# Patient Record
Sex: Male | Born: 1935
Health system: Southern US, Community
[De-identification: ages and names within clinical notes are randomized; demographics above are authoritative.]

## PROBLEM LIST (undated history)

## (undated) DIAGNOSIS — E785 Hyperlipidemia, unspecified: Secondary | ICD-10-CM

## (undated) DIAGNOSIS — G5600 Carpal tunnel syndrome, unspecified upper limb: Secondary | ICD-10-CM

## (undated) DIAGNOSIS — H409 Unspecified glaucoma: Secondary | ICD-10-CM

## (undated) DIAGNOSIS — F329 Major depressive disorder, single episode, unspecified: Secondary | ICD-10-CM

## (undated) DIAGNOSIS — Z87898 Personal history of other specified conditions: Secondary | ICD-10-CM

## (undated) DIAGNOSIS — J019 Acute sinusitis, unspecified: Secondary | ICD-10-CM

## (undated) DIAGNOSIS — R5381 Other malaise: Secondary | ICD-10-CM

## (undated) DIAGNOSIS — I428 Other cardiomyopathies: Secondary | ICD-10-CM

## (undated) DIAGNOSIS — R5383 Other fatigue: Secondary | ICD-10-CM

## (undated) DIAGNOSIS — I252 Old myocardial infarction: Secondary | ICD-10-CM

## (undated) DIAGNOSIS — E739 Lactose intolerance, unspecified: Secondary | ICD-10-CM

## (undated) DIAGNOSIS — M1710 Unilateral primary osteoarthritis, unspecified knee: Secondary | ICD-10-CM

## (undated) DIAGNOSIS — M1990 Unspecified osteoarthritis, unspecified site: Secondary | ICD-10-CM

## (undated) DIAGNOSIS — J069 Acute upper respiratory infection, unspecified: Secondary | ICD-10-CM

## (undated) DIAGNOSIS — E7800 Pure hypercholesterolemia, unspecified: Secondary | ICD-10-CM

## (undated) DIAGNOSIS — IMO0002 Reserved for concepts with insufficient information to code with codable children: Secondary | ICD-10-CM

## (undated) DIAGNOSIS — J309 Allergic rhinitis, unspecified: Secondary | ICD-10-CM

## (undated) DIAGNOSIS — F411 Generalized anxiety disorder: Secondary | ICD-10-CM

## (undated) DIAGNOSIS — F3289 Other specified depressive episodes: Secondary | ICD-10-CM

## (undated) HISTORY — DX: Hyperlipidemia, unspecified: E78.5

## (undated) HISTORY — DX: Unspecified osteoarthritis, unspecified site: M19.90

## (undated) HISTORY — DX: Carpal tunnel syndrome, unspecified upper limb: G56.00

## (undated) HISTORY — DX: Other cardiomyopathies: I42.8

## (undated) HISTORY — DX: Unilateral primary osteoarthritis, unspecified knee: M17.10

## (undated) HISTORY — DX: Major depressive disorder, single episode, unspecified: F32.9

## (undated) HISTORY — DX: Lactose intolerance, unspecified: E73.9

## (undated) HISTORY — PX: CATARACT EXTRACTION: SUR2

## (undated) HISTORY — DX: Other specified depressive episodes: F32.89

## (undated) HISTORY — DX: Acute upper respiratory infection, unspecified: J06.9

## (undated) HISTORY — DX: Other fatigue: R53.83

## (undated) HISTORY — DX: Old myocardial infarction: I25.2

## (undated) HISTORY — DX: Personal history of other specified conditions: Z87.898

## (undated) HISTORY — DX: Allergic rhinitis, unspecified: J30.9

## (undated) HISTORY — DX: Unspecified glaucoma: H40.9

## (undated) HISTORY — DX: Other malaise: R53.81

## (undated) HISTORY — DX: Reserved for concepts with insufficient information to code with codable children: IMO0002

## (undated) HISTORY — DX: Pure hypercholesterolemia, unspecified: E78.00

## (undated) HISTORY — DX: Generalized anxiety disorder: F41.1

## (undated) HISTORY — DX: Acute sinusitis, unspecified: J01.90

---

## 1985-05-15 HISTORY — PX: TURP VAPORIZATION: SUR1397

## 2003-08-31 LAB — HM COLONOSCOPY

## 2003-10-23 ENCOUNTER — Encounter: Admission: RE | Admit: 2003-10-23 | Discharge: 2003-10-23 | Payer: Self-pay | Admitting: Sports Medicine

## 2003-10-30 ENCOUNTER — Encounter: Admission: RE | Admit: 2003-10-30 | Discharge: 2003-10-30 | Payer: Self-pay | Admitting: Family Medicine

## 2003-11-13 ENCOUNTER — Encounter: Admission: RE | Admit: 2003-11-13 | Discharge: 2003-11-13 | Payer: Self-pay | Admitting: Family Medicine

## 2003-11-20 ENCOUNTER — Encounter: Admission: RE | Admit: 2003-11-20 | Discharge: 2003-11-20 | Payer: Self-pay | Admitting: Sports Medicine

## 2004-02-22 ENCOUNTER — Ambulatory Visit (HOSPITAL_COMMUNITY): Admission: RE | Admit: 2004-02-22 | Discharge: 2004-02-22 | Payer: Self-pay | Admitting: Neurosurgery

## 2004-12-08 ENCOUNTER — Emergency Department (HOSPITAL_COMMUNITY): Admission: EM | Admit: 2004-12-08 | Discharge: 2004-12-08 | Payer: Self-pay | Admitting: Emergency Medicine

## 2005-04-13 ENCOUNTER — Inpatient Hospital Stay (HOSPITAL_COMMUNITY): Admission: EM | Admit: 2005-04-13 | Discharge: 2005-04-17 | Payer: Self-pay | Admitting: Emergency Medicine

## 2005-04-13 ENCOUNTER — Ambulatory Visit: Payer: Self-pay | Admitting: Cardiology

## 2005-04-14 ENCOUNTER — Encounter: Payer: Self-pay | Admitting: Internal Medicine

## 2005-04-14 ENCOUNTER — Ambulatory Visit: Payer: Self-pay | Admitting: Internal Medicine

## 2005-04-25 ENCOUNTER — Ambulatory Visit: Payer: Self-pay | Admitting: Internal Medicine

## 2005-04-28 ENCOUNTER — Ambulatory Visit: Payer: Self-pay | Admitting: Internal Medicine

## 2005-05-01 ENCOUNTER — Ambulatory Visit: Payer: Self-pay | Admitting: Cardiology

## 2005-05-01 ENCOUNTER — Emergency Department (HOSPITAL_COMMUNITY): Admission: EM | Admit: 2005-05-01 | Discharge: 2005-05-01 | Payer: Self-pay | Admitting: Emergency Medicine

## 2005-05-02 ENCOUNTER — Ambulatory Visit: Payer: Self-pay | Admitting: Cardiovascular Disease

## 2005-05-10 ENCOUNTER — Ambulatory Visit: Payer: Self-pay | Admitting: Internal Medicine

## 2005-05-11 ENCOUNTER — Encounter (HOSPITAL_COMMUNITY): Admission: RE | Admit: 2005-05-11 | Discharge: 2005-08-09 | Payer: Self-pay | Admitting: Cardiology

## 2005-05-17 ENCOUNTER — Ambulatory Visit: Payer: Self-pay | Admitting: Internal Medicine

## 2005-06-05 ENCOUNTER — Ambulatory Visit: Payer: Self-pay | Admitting: Cardiology

## 2005-06-13 ENCOUNTER — Ambulatory Visit: Payer: Self-pay

## 2005-06-13 ENCOUNTER — Encounter: Payer: Self-pay | Admitting: Cardiology

## 2005-07-13 ENCOUNTER — Ambulatory Visit: Payer: Self-pay | Admitting: Cardiology

## 2005-09-05 ENCOUNTER — Ambulatory Visit: Payer: Self-pay | Admitting: Internal Medicine

## 2005-09-08 ENCOUNTER — Ambulatory Visit: Payer: Self-pay | Admitting: Internal Medicine

## 2005-11-30 ENCOUNTER — Ambulatory Visit: Payer: Self-pay | Admitting: Internal Medicine

## 2005-12-05 ENCOUNTER — Ambulatory Visit: Admission: RE | Admit: 2005-12-05 | Discharge: 2005-12-05 | Payer: Self-pay | Admitting: Internal Medicine

## 2005-12-05 ENCOUNTER — Ambulatory Visit: Payer: Self-pay | Admitting: *Deleted

## 2005-12-06 ENCOUNTER — Ambulatory Visit: Payer: Self-pay | Admitting: Cardiology

## 2006-01-01 ENCOUNTER — Ambulatory Visit: Payer: Self-pay | Admitting: Internal Medicine

## 2006-01-11 ENCOUNTER — Ambulatory Visit: Payer: Self-pay | Admitting: Critical Care Medicine

## 2006-01-18 ENCOUNTER — Ambulatory Visit: Payer: Self-pay | Admitting: Cardiology

## 2006-02-06 ENCOUNTER — Encounter: Admission: RE | Admit: 2006-02-06 | Discharge: 2006-02-06 | Payer: Self-pay | Admitting: General Surgery

## 2006-04-17 ENCOUNTER — Ambulatory Visit: Payer: Self-pay | Admitting: Cardiology

## 2006-04-24 ENCOUNTER — Ambulatory Visit: Payer: Self-pay

## 2006-05-01 ENCOUNTER — Ambulatory Visit (HOSPITAL_COMMUNITY): Admission: RE | Admit: 2006-05-01 | Discharge: 2006-05-02 | Payer: Self-pay | Admitting: Cardiology

## 2006-05-22 ENCOUNTER — Ambulatory Visit: Payer: Self-pay | Admitting: Cardiology

## 2006-06-18 ENCOUNTER — Ambulatory Visit: Payer: Self-pay | Admitting: Internal Medicine

## 2006-06-18 LAB — CONVERTED CEMR LAB
Bacteria, UA: NEGATIVE
Bilirubin Urine: NEGATIVE
Crystals: NEGATIVE
Hemoglobin, Urine: NEGATIVE
Ketones, ur: NEGATIVE mg/dL
Leukocytes, UA: NEGATIVE
Mucus, UA: NEGATIVE
Nitrite: NEGATIVE
Specific Gravity, Urine: 1.01 (ref 1.000–1.03)
Total Protein, Urine: NEGATIVE mg/dL
Urine Glucose: NEGATIVE mg/dL
Urobilinogen, UA: 0.2 (ref 0.0–1.0)
pH: 7 (ref 5.0–8.0)

## 2006-07-09 ENCOUNTER — Ambulatory Visit: Payer: Self-pay | Admitting: Internal Medicine

## 2006-07-09 ENCOUNTER — Ambulatory Visit: Payer: Self-pay | Admitting: Cardiology

## 2006-07-09 LAB — CONVERTED CEMR LAB
ALT: 21 units/L (ref 0–40)
AST: 24 units/L (ref 0–37)
Albumin: 3.7 g/dL (ref 3.5–5.2)
Alkaline Phosphatase: 56 units/L (ref 39–117)
Bilirubin, Direct: 0.2 mg/dL (ref 0.0–0.3)
Cholesterol: 110 mg/dL (ref 0–200)
HDL: 39.3 mg/dL (ref >39.0)
Hgb A1c MFr Bld: 5.4 % (ref 4.6–6.0)
LDL Cholesterol: 60 mg/dL (ref 0–99)
Total Bilirubin: 0.8 mg/dL (ref 0.3–1.2)
Total CHOL/HDL Ratio: 2.8
Total Protein: 7 g/dL (ref 6.0–8.3)
Triglycerides: 53 mg/dL (ref 0–149)
VLDL: 11 mg/dL (ref 0–40)

## 2006-07-16 ENCOUNTER — Ambulatory Visit: Payer: Self-pay | Admitting: Cardiology

## 2006-10-26 ENCOUNTER — Ambulatory Visit: Payer: Self-pay | Admitting: Internal Medicine

## 2006-12-13 ENCOUNTER — Ambulatory Visit: Payer: Self-pay | Admitting: Internal Medicine

## 2007-04-05 ENCOUNTER — Encounter: Payer: Self-pay | Admitting: Internal Medicine

## 2007-06-14 DIAGNOSIS — I10 Essential (primary) hypertension: Secondary | ICD-10-CM | POA: Insufficient documentation

## 2007-06-14 DIAGNOSIS — F329 Major depressive disorder, single episode, unspecified: Secondary | ICD-10-CM

## 2007-06-14 DIAGNOSIS — Z87898 Personal history of other specified conditions: Secondary | ICD-10-CM

## 2007-06-14 DIAGNOSIS — I2510 Atherosclerotic heart disease of native coronary artery without angina pectoris: Secondary | ICD-10-CM

## 2007-06-14 DIAGNOSIS — H409 Unspecified glaucoma: Secondary | ICD-10-CM | POA: Insufficient documentation

## 2007-06-14 DIAGNOSIS — E785 Hyperlipidemia, unspecified: Secondary | ICD-10-CM

## 2007-07-18 ENCOUNTER — Ambulatory Visit: Payer: Self-pay | Admitting: Cardiology

## 2007-07-19 ENCOUNTER — Ambulatory Visit: Payer: Self-pay | Admitting: Cardiology

## 2007-07-19 LAB — CONVERTED CEMR LAB
ALT: 21 units/L (ref 0–53)
AST: 23 units/L (ref 0–37)
Albumin: 3.7 g/dL (ref 3.5–5.2)
Alkaline Phosphatase: 44 units/L (ref 39–117)
Bilirubin, Direct: 0.2 mg/dL (ref 0.0–0.3)
Cholesterol: 119 mg/dL (ref 0–200)
HDL: 35.6 mg/dL — ABNORMAL LOW (ref >39.0)
LDL Cholesterol: 69 mg/dL (ref 0–99)
Total Bilirubin: 1.1 mg/dL (ref 0.3–1.2)
Total CHOL/HDL Ratio: 3.3
Total Protein: 7.1 g/dL (ref 6.0–8.3)
Triglycerides: 72 mg/dL (ref 0–149)
VLDL: 14 mg/dL (ref 0–40)

## 2007-07-23 ENCOUNTER — Telehealth (INDEPENDENT_AMBULATORY_CARE_PROVIDER_SITE_OTHER): Payer: Self-pay | Admitting: *Deleted

## 2007-07-23 ENCOUNTER — Ambulatory Visit: Payer: Self-pay | Admitting: Internal Medicine

## 2007-07-23 DIAGNOSIS — J309 Allergic rhinitis, unspecified: Secondary | ICD-10-CM

## 2007-07-23 DIAGNOSIS — J019 Acute sinusitis, unspecified: Secondary | ICD-10-CM | POA: Insufficient documentation

## 2007-07-23 DIAGNOSIS — I252 Old myocardial infarction: Secondary | ICD-10-CM

## 2007-07-23 DIAGNOSIS — N400 Enlarged prostate without lower urinary tract symptoms: Secondary | ICD-10-CM

## 2007-07-23 DIAGNOSIS — F411 Generalized anxiety disorder: Secondary | ICD-10-CM | POA: Insufficient documentation

## 2007-10-03 ENCOUNTER — Ambulatory Visit: Payer: Self-pay | Admitting: Internal Medicine

## 2007-10-03 DIAGNOSIS — IMO0002 Reserved for concepts with insufficient information to code with codable children: Secondary | ICD-10-CM | POA: Insufficient documentation

## 2007-10-03 DIAGNOSIS — M1710 Unilateral primary osteoarthritis, unspecified knee: Secondary | ICD-10-CM | POA: Insufficient documentation

## 2008-01-21 ENCOUNTER — Ambulatory Visit: Payer: Self-pay | Admitting: Cardiology

## 2008-01-21 LAB — CONVERTED CEMR LAB
ALT: 25 units/L (ref 0–53)
AST: 25 units/L (ref 0–37)
Albumin: 4.2 g/dL (ref 3.5–5.2)
Alkaline Phosphatase: 49 units/L (ref 39–117)
BUN: 7 mg/dL (ref 6–23)
Bilirubin, Direct: 0.2 mg/dL (ref 0.0–0.3)
CO2: 27 meq/L (ref 19–32)
Calcium: 9.3 mg/dL (ref 8.4–10.5)
Chloride: 107 meq/L (ref 96–112)
Cholesterol: 120 mg/dL (ref 0–200)
Creatinine, Ser: 0.9 mg/dL (ref 0.4–1.5)
GFR calc Af Amer: 108 mL/min
GFR calc non Af Amer: 89 mL/min
Glucose, Bld: 114 mg/dL — ABNORMAL HIGH (ref 70–99)
HDL: 36.6 mg/dL — ABNORMAL LOW (ref >39.0)
LDL Cholesterol: 68 mg/dL (ref 0–99)
Potassium: 4.2 meq/L (ref 3.5–5.1)
Sodium: 140 meq/L (ref 135–145)
Total Bilirubin: 1.2 mg/dL (ref 0.3–1.2)
Total CHOL/HDL Ratio: 3.3
Total Protein: 7.5 g/dL (ref 6.0–8.3)
Triglycerides: 79 mg/dL (ref 0–149)
VLDL: 16 mg/dL (ref 0–40)

## 2008-04-14 ENCOUNTER — Ambulatory Visit: Payer: Self-pay | Admitting: Cardiology

## 2008-04-14 LAB — CONVERTED CEMR LAB
ALT: 20 units/L (ref 0–53)
AST: 25 units/L (ref 0–37)
Albumin: 3.8 g/dL (ref 3.5–5.2)
Alkaline Phosphatase: 47 units/L (ref 39–117)
Bilirubin, Direct: 0.1 mg/dL (ref 0.0–0.3)
Cholesterol: 104 mg/dL (ref 0–200)
HDL: 39.2 mg/dL (ref >39.0)
LDL Cholesterol: 51 mg/dL (ref 0–99)
Total Bilirubin: 1 mg/dL (ref 0.3–1.2)
Total CHOL/HDL Ratio: 2.7
Total Protein: 7 g/dL (ref 6.0–8.3)
Triglycerides: 70 mg/dL (ref 0–149)
VLDL: 14 mg/dL (ref 0–40)

## 2008-04-21 ENCOUNTER — Ambulatory Visit: Payer: Self-pay | Admitting: Internal Medicine

## 2008-04-21 LAB — CONVERTED CEMR LAB
Bilirubin Urine: NEGATIVE
Blood in Urine, dipstick: NEGATIVE
Glucose, Urine, Semiquant: NEGATIVE
Ketones, urine, test strip: NEGATIVE
Nitrite: NEGATIVE
Protein, U semiquant: 30
Specific Gravity, Urine: <1.005
Urobilinogen, UA: 0.2
WBC Urine, dipstick: NEGATIVE
pH: 8.5

## 2008-10-13 ENCOUNTER — Telehealth: Payer: Self-pay | Admitting: Internal Medicine

## 2008-10-14 ENCOUNTER — Ambulatory Visit: Payer: Self-pay | Admitting: Internal Medicine

## 2008-10-14 LAB — CONVERTED CEMR LAB
ALT: 22 units/L (ref 0–53)
AST: 31 units/L (ref 0–37)
BUN: 14 mg/dL (ref 6–23)
CO2: 27 meq/L (ref 19–32)
Calcium: 8.5 mg/dL (ref 8.4–10.5)
Chloride: 110 meq/L (ref 96–112)
Cholesterol: 104 mg/dL (ref 0–200)
Creatinine, Ser: 0.9 mg/dL (ref 0.4–1.5)
GFR calc non Af Amer: 88.5 mL/min (ref >60)
Glucose, Bld: 97 mg/dL (ref 70–99)
HDL: 37.7 mg/dL — ABNORMAL LOW (ref >39.00)
Hgb A1c MFr Bld: 5.4 % (ref 4.6–6.5)
LDL Cholesterol: 56 mg/dL (ref 0–99)
Potassium: 4.6 meq/L (ref 3.5–5.1)
Sodium: 141 meq/L (ref 135–145)
TSH: 1.31 microintl units/mL (ref 0.35–5.50)
Total CHOL/HDL Ratio: 3
Triglycerides: 52 mg/dL (ref 0.0–149.0)
VLDL: 10.4 mg/dL (ref 0.0–40.0)

## 2008-10-20 ENCOUNTER — Ambulatory Visit: Payer: Self-pay | Admitting: Internal Medicine

## 2008-10-20 DIAGNOSIS — J069 Acute upper respiratory infection, unspecified: Secondary | ICD-10-CM | POA: Insufficient documentation

## 2008-12-08 ENCOUNTER — Encounter (INDEPENDENT_AMBULATORY_CARE_PROVIDER_SITE_OTHER): Payer: Self-pay | Admitting: *Deleted

## 2009-02-10 DIAGNOSIS — I428 Other cardiomyopathies: Secondary | ICD-10-CM

## 2009-02-10 DIAGNOSIS — M1990 Unspecified osteoarthritis, unspecified site: Secondary | ICD-10-CM | POA: Insufficient documentation

## 2009-02-10 DIAGNOSIS — E7800 Pure hypercholesterolemia, unspecified: Secondary | ICD-10-CM

## 2009-02-11 ENCOUNTER — Ambulatory Visit: Payer: Self-pay | Admitting: Cardiology

## 2009-07-16 ENCOUNTER — Ambulatory Visit: Payer: Self-pay | Admitting: Internal Medicine

## 2009-07-16 DIAGNOSIS — G5600 Carpal tunnel syndrome, unspecified upper limb: Secondary | ICD-10-CM

## 2009-08-09 ENCOUNTER — Telehealth: Payer: Self-pay | Admitting: Internal Medicine

## 2009-09-10 ENCOUNTER — Telehealth: Payer: Self-pay | Admitting: Internal Medicine

## 2009-10-12 ENCOUNTER — Telehealth: Payer: Self-pay | Admitting: Internal Medicine

## 2010-02-15 ENCOUNTER — Ambulatory Visit: Payer: Self-pay | Admitting: Internal Medicine

## 2010-02-15 DIAGNOSIS — R5381 Other malaise: Secondary | ICD-10-CM

## 2010-02-15 DIAGNOSIS — R5383 Other fatigue: Secondary | ICD-10-CM

## 2010-02-15 DIAGNOSIS — E739 Lactose intolerance, unspecified: Secondary | ICD-10-CM

## 2010-02-15 LAB — CONVERTED CEMR LAB
ALT: 18 units/L (ref 0–53)
AST: 24 units/L (ref 0–37)
Albumin: 4 g/dL (ref 3.5–5.2)
Alkaline Phosphatase: 78 units/L (ref 39–117)
BUN: 11 mg/dL (ref 6–23)
Basophils Absolute: 0 10*3/uL (ref 0.0–0.1)
Basophils Relative: 0.4 % (ref 0.0–3.0)
Bilirubin Urine: NEGATIVE
Bilirubin, Direct: 0.2 mg/dL (ref 0.0–0.3)
CO2: 27 meq/L (ref 19–32)
Calcium: 9.4 mg/dL (ref 8.4–10.5)
Chloride: 109 meq/L (ref 96–112)
Cholesterol: 106 mg/dL (ref 0–200)
Creatinine, Ser: 0.8 mg/dL (ref 0.4–1.5)
Eosinophils Absolute: 0.3 10*3/uL (ref 0.0–0.7)
Eosinophils Relative: 4.5 % (ref 0.0–5.0)
GFR calc non Af Amer: 95.47 mL/min (ref >60)
Glucose, Bld: 102 mg/dL — ABNORMAL HIGH (ref 70–99)
HCT: 44 % (ref 39.0–52.0)
HDL: 43.5 mg/dL (ref >39.00)
Hemoglobin: 15.2 g/dL (ref 13.0–17.0)
Hgb A1c MFr Bld: 5.4 % (ref 4.6–6.5)
Ketones, ur: NEGATIVE mg/dL
LDL Cholesterol: 55 mg/dL (ref 0–99)
Leukocytes, UA: NEGATIVE
Lymphocytes Relative: 30.2 % (ref 12.0–46.0)
Lymphs Abs: 2 10*3/uL (ref 0.7–4.0)
MCHC: 34.5 g/dL (ref 30.0–36.0)
MCV: 95.5 fL (ref 78.0–100.0)
Monocytes Absolute: 0.5 10*3/uL (ref 0.1–1.0)
Monocytes Relative: 8.1 % (ref 3.0–12.0)
Neutro Abs: 3.8 10*3/uL (ref 1.4–7.7)
Neutrophils Relative %: 56.8 % (ref 43.0–77.0)
Nitrite: NEGATIVE
Platelets: 141 10*3/uL — ABNORMAL LOW (ref 150.0–400.0)
Potassium: 5.1 meq/L (ref 3.5–5.1)
Pro B Natriuretic peptide (BNP): 73.1 pg/mL (ref 0.0–100.0)
RBC: 4.6 M/uL (ref 4.22–5.81)
RDW: 12.9 % (ref 11.5–14.6)
Sed Rate: 8 mm/hr (ref 0–22)
Sodium: 142 meq/L (ref 135–145)
Specific Gravity, Urine: <=1.005 (ref 1.000–1.030)
TSH: 1.33 microintl units/mL (ref 0.35–5.50)
Total Bilirubin: 0.9 mg/dL (ref 0.3–1.2)
Total CHOL/HDL Ratio: 2
Total Protein, Urine: NEGATIVE mg/dL
Total Protein: 7.1 g/dL (ref 6.0–8.3)
Triglycerides: 39 mg/dL (ref 0.0–149.0)
Urine Glucose: NEGATIVE mg/dL
Urobilinogen, UA: 0.2 (ref 0.0–1.0)
VLDL: 7.8 mg/dL (ref 0.0–40.0)
WBC: 6.8 10*3/uL (ref 4.5–10.5)
pH: 6 (ref 5.0–8.0)

## 2010-03-07 ENCOUNTER — Encounter: Payer: Self-pay | Admitting: Cardiology

## 2010-03-07 ENCOUNTER — Ambulatory Visit: Payer: Self-pay | Admitting: Cardiology

## 2010-05-06 ENCOUNTER — Encounter: Payer: Self-pay | Admitting: Internal Medicine

## 2010-06-16 NOTE — Progress Notes (Signed)
Summary: Voltaren Gel  Phone Note Refill Request Message from:  Fax from Pharmacy on Oct 12, 2009 9:31 AM  Refills Requested: Medication #1:  VOLTAREN 1 % GEL apply 2 grams qid   Dosage confirmed as above?Dosage Confirmed   Brand Name Necessary? No   Supply Requested: 1 month   Last Refilled: 10/09/2009 Voltaren Gel 1% aty 500 apply 2 gms four times a day    Method Requested: Electronic Next Appointment Scheduled: None Initial call taken by: Glendell Docker CMA,  Oct 12, 2009 9:31 AM  Follow-up for Phone Call        ok to refill x 2 Follow-up by: D. Thomos Lemons DO,  October 13, 2009 12:16 PM  Additional Follow-up for Phone Call Additional follow up Details #1::        patient informed rx sent to pharmacy Additional Follow-up by: Glendell Docker CMA,  October 13, 2009 3:41 PM    Prescriptions: VOLTAREN 1 % GEL (DICLOFENAC SODIUM) apply 2 grams qid  #5 x 2   Entered by:   Glendell Docker CMA   Authorized by:   D. Thomos Lemons DO   Signed by:   Glendell Docker CMA on 10/13/2009   Method used:   Electronically to        [facility]  [phone]* (retail)       [address]       [place], [place]  [address]       Ph: [phone] or [phone]       Fax: [phone]   RxID:   [id number]

## 2010-06-16 NOTE — Assessment & Plan Note (Signed)
Summary: knee problem--last ov w/dr jwj-2009--stc   Vital Signs:  Patient profile:   75 year old male Height:      70 inches Weight:      178.25 pounds BMI:     25.67 O2 Sat:      94 % on Room air Temp:     98.6 degrees F oral Pulse rate:   76 / minute BP sitting:   102 / 58  (left arm) Cuff size:   regular  Vitals Entered By: Zella Ball Ewing CMA Duncan Dull) (February 15, 2010 11:26 AM)  O2 Flow:  Room air  Preventive Care Screening  Last Flu Shot:    Date:  01/20/2010    Results:  given      gets PSA with urology  CC: Knee problem, labs, refills/RE/wellness   Primary Care Makinsley Schiavi:  Dondra Spry DO  CC:  Knee problem, labs, and refills/RE/wellness.  History of Present Illness: here for wellness and f/u - c/o bilat knee pain, needs med refills, no recent falls or injury or swelling; Pt denies CP, worsening sob, doe, wheezing, orthopnea, pnd, worsening LE edema, palps, dizziness or syncope  Pt denies new neuro symptoms such as headache, facial or extremity weakness  Pt denies polydipsia, polyuria.  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  No worsening sweling or tenderness, fall or injury, but has ongoing pain, not completely controlled wtih voltaren gel  Did better with meloxicam last yr without hx of PUD, worsening renal dysfysn or other intolerance.    Here for wellness Diet: Heart Healthy or DM if diabetic Physical Activities: Sedentary - walks 1 hour per day Depression/mood screen: mild - declines tx at this time Hearing: decreased bilateral - has new hearing aids on order Visual Acuity: Grossly normal, gets exam twice yearly, wears glasses ADL's: Capable  Fall Risk: None Home Safety: Good Cognitive Impairment:  Gen appearance, affect, speech,  memory, attention & motor skills grossly intact End-of-Life Planning: Advance directive - Full code/I agree   Problems Prior to Update: 1)  Fatigue  (ICD-780.79) 2)  Glucose Intolerance  (ICD-271.3) 3)  Carpal Tunnel Syndrome, Left  (ICD-354.0) 4)  Hypertension  (ICD-401.9) 5)  Hyperlipidemia  (ICD-272.4) 6)  Coronary Artery Disease  (ICD-414.00) 7)  Cardiomyopathy  (ICD-425.4) 8)  Uri  (ICD-465.9) 9)  Health Maintenance Exam  (ICD-V70.0) 10)  Degenerative Joint Disease, Knees, Bilateral  (ICD-715.96) 11)  Anxiety  (ICD-300.00) 12)  Allergic Rhinitis  (ICD-477.9) 13)  Myocardial Infarction, Hx of  (ICD-412) 14)  Benign Prostatic Hypertrophy  (ICD-600.00) 15)  Sinusitis- Acute-nos  (ICD-461.9) 16)  Glaucoma  (ICD-365.9) 17)  Benign Prostatic Hypertrophy, Hx of  (ICD-V13.8) 18)  Depression  (ICD-311) 19)  Hypercholesterolemia  (ICD-272.0) 20)  Osteoarthritis  (ICD-715.90)  Medications Prior to Update: 1)  Niaspan 1000 Mg Tbcr (Niacin (Antihyperlipidemic)) .... Take 2 Tablet Every Night 2)  Plavix 75 Mg Tabs (Clopidogrel Bisulfate) .... Take 1 Tablet By Mouth Once A Day 3)  Zocor 20 Mg Tabs (Simvastatin) .... Take 1 Tablet By Mouth Every Night 4)  Ecotrin Low Strength 81 Mg  Tbec (Aspirin) .... Take 1 Tablet By Mouth Once A Day 5)  Diovan 40 Mg  Tabs (Valsartan) .Marland Kitchen.. 1po Qd 6)  Doxazosin Mesylate 8 Mg  Tabs (  Doxazosin Mesylate) .... Take 1/2  Tablet By Mouth Once A Day 7)  Carvedilol 25 Mg  Tabs (Carvedilol) .... Take 1/2 Tab Qam and Take 1/2 Qhs 8)  Nitroglycerin 0.4 Mg Subl (Nitroglycerin) .... As Needed As Directed For Chest Pain 9)  Voltaren 1 % Gel (Diclofenac Sodium) .... Apply 2 Grams Qid 10)  Travatan Z 0.004 % Soln (Travoprost) .... At Bedtime 11)  Bee Pollen 500 Mg Tabs (Bee Pollen) .... Take 1 Tablet By Mouth Two Times A Day 12)  Osteo Bi-Flex Joint Shield  Tabs (Misc Natural Products) .... Take 1 Tablet By Mouth Two Times A Day 13)  Citalopram Hydrobromide 20 Mg Tabs  (Citalopram Hydrobromide) .... Take 1 Tablet By Mouth Once A Day 14)  Fexofenadine Hcl 180 Mg Tabs (Fexofenadine Hcl) .... One By Mouth Once Daily  Current Medications (verified): 1)  Niaspan 1000 Mg Tbcr (Niacin (Antihyperlipidemic)) .... Take 2 Tablet Every Night 2)  Plavix 75 Mg Tabs (Clopidogrel Bisulfate) .... Take 1 Tablet By Mouth Once A Day 3)  Zocor 20 Mg Tabs (Simvastatin) .... Take 1 Tablet By Mouth Every Night 4)  Ecotrin Low Strength 81 Mg  Tbec (Aspirin) .... Take 1 Tablet By Mouth Once A Day 5)  Diovan 40 Mg  Tabs (Valsartan) .Marland Kitchen.. 1po Qd 6)  Doxazosin Mesylate 8 Mg  Tabs (Doxazosin Mesylate) .... Take 1/2  Tablet By Mouth Once A Day 7)  Carvedilol 25 Mg  Tabs (Carvedilol) .... Take 1/2 Tab Qam and Take 1/2 Qhs 8)  Nitroglycerin 0.4 Mg Subl (Nitroglycerin) .... As Needed As Directed For Chest Pain 9)  Voltaren 1 % Gel (Diclofenac Sodium) .... Apply 2 Grams Qid 10)  Travatan Z 0.004 % Soln (Travoprost) .... At Bedtime 11)  Bee Pollen 500 Mg Tabs (Bee Pollen) .... Take 1 Tablet By Mouth Two Times A Day 12)  Osteo Bi-Flex Joint Shield  Tabs (Misc Natural Products) .... Take 1 Tablet By Mouth Two Times A Day 13)  Citalopram Hydrobromide 20 Mg Tabs (Citalopram Hydrobromide) .... Take 1 Tablet By Mouth Once A Day 14)  Fexofenadine Hcl 180 Mg Tabs (Fexofenadine Hcl) .... One By Mouth Once Daily 15)  Meloxicam 15 Mg Tabs (Meloxicam) .Marland Kitchen.. 1po Once Daily As Needed  Allergies (verified): No Known Drug Allergies  Past History:  Family History: Last updated: 07/16/2009 brother alive but had CVA this year, Brother died of alcoholic cirrhosis, Father MI at 19, Mother CVA at 45, one healthy brother, sister died of MS or ALS      Social History: Last updated: 07/16/2009 lives alone, divorced in 1987, no kids, semi-retired truck Hospital doctor, grew up in [place], enjoys trucks, reading and TV; quit smoking 10 yrs ago, drinks a glass of wine daily, no illicit drug use      Risk  Factors: Alcohol Use: 4 (10/20/2008) Caffeine Use: 2 beverages daily (10/20/2008) Exercise: yes (10/20/2008)  Risk Factors: Smoking Status: quit (07/16/2009)  Past Medical History: HYPERTENSION (ICD-401.9) HYPERLIPIDEMIA (ICD-272.4)  CORONARY ARTERY DISEASE (ICD-414.00) (November 2006 anterior myocardial     infarction.  Occluded right coronary artery.  He had stents placed     to his LAD.  His EF was 35%.  Last catheterization was in 2007.  He     had an LAD with 2 overlapping stents.  The mid vessel had 30%     stenosis.  There was a small diagonal.  This was jailed. The     circumflex had 40% ostial stenosis.  In the marginal,  he had 30%     proximal stenosis.  The right coronary was occluded). CARDIOMYOPATHY (ICD-425.4)  (EF was 35% initially, increased to 54% on cath 07) URI (ICD-465.9) HEALTH MAINTENANCE EXAM (ICD-V70.0) DEGENERATIVE JOINT DISEASE, KNEES, BILATERAL (ICD-715.96) ANXIETY (ICD-300.00) ALLERGIC RHINITIS (ICD-477.9) MYOCARDIAL INFARCTION, HX OF (ICD-412) BENIGN PROSTATIC HYPERTROPHY (ICD-600.00) SINUSITIS- ACUTE-NOS (ICD-461.9) GLAUCOMA (ICD-365.9) left eye BENIGN PROSTATIC HYPERTROPHY, HX OF (ICD-V13.8) DEPRESSION (ICD-311) HYPERCHOLESTEROLEMIA (ICD-272.0) OSTEOARTHRITIS (ICD-715.90) DJD - bilat knees MD roster:    optho- [facility] on [address]                     urology - Dr Retta Diones                      card -  Dr Ted Mcalpine - Dr Elmon Else                      Past Surgical History: Reviewed history from 07/16/2009 and no changes required. TURP - 05/15/1985    Family History: Reviewed history from 07/16/2009 and no changes required. brother alive but had CVA this year, Brother died of alcoholic cirrhosis, Father MI at 54, Mother CVA at 19, one healthy brother, sister died of MS or ALS      Social History: Reviewed history from 07/16/2009 and no changes required. 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  The patient denies anorexia, fever, vision loss, decreased hearing, hoarseness, chest pain, syncope, dyspnea on exertion, peripheral edema, prolonged cough, headaches, hemoptysis, abdominal pain, melena, hematochezia, severe indigestion/heartburn, hematuria, muscle weakness, suspicious skin lesions, transient blindness, depression, unusual weight change, abnormal bleeding, enlarged lymph nodes, and angioedema.         all otherwise negative per pt -  - to see cards oct 24;  has some very mild fatigue at best, intermittent - normally states energy pretty good  Physical Exam  General:  alert and well-developed.  alert and well-developed.   Head:  normocephalic and atraumatic.  normocephalic and atraumatic.   Eyes:  vision grossly intact, pupils equal, and pupils round.  vision grossly intact, pupils equal, and pupils round.   Ears:  R ear normal and L ear normal.  R ear normal and L ear normal.   Nose:  no external deformity and no nasal discharge.  no external deformity and no nasal discharge.   Mouth:  no gingival abnormalities and pharynx pink and moist.  no gingival abnormalities and pharynx pink and moist.   Neck:  supple and no masses.  supple and no masses.   Lungs:  normal respiratory effort and normal breath sounds.  normal respiratory effort and normal breath sounds.   Heart:  normal rate and regular rhythm.  normal rate and regular rhythm.   Abdomen:  soft, non-tender, and normal bowel sounds.  soft, non-tender, and normal bowel sounds.   Msk:  bialt knees with significant crepitus, no effusion Extremities:  no edema, no erythema  Neurologic:  cognitive intact to orientation, recall, naming, and repetition cranial nerves II-XII intact and strength normal in all  extremities.  cranial nerves II-XII intact and strength normal in all extremities.     Impression & Recommendations:  Problem # 1:  Preventive Health Care (ICD-V70.0)  Overall doing well, age appropriate education and counseling updated and referral for appropriate preventive services done unless declined, immunizations up to date or declined, diet counseling done if overweight, urged to quit smoking if smokes , most recent labs reviewed and current ordered if appropriate, ecg reviewed or declined (interpretation per ECG scanned in the EMR if done); information regarding Medicare Prevention requirements given if appropriate; speciality referrals updated as appropriate ;  declines AAA screen with aortic u/s at this time  Orders: Medicare -1st Annual Wellness Visit [phone])  Problem # 2:  DEGENERATIVE JOINT DISEASE, KNEES, BILATERAL (ICD-715.96)  His updated medication list for this problem includes:    Ecotrin Low Strength 81 Mg Tbec (Aspirin) .Marland Kitchen... Take 1 tablet by mouth once a day    Meloxicam 15 Mg Tabs (Meloxicam) .Marland Kitchen... 1po once daily as needed stable overall by hx and exam, ok to continue meds/tx as is - for med refills today  Orders: Prescription Created Electronically [phone])  Problem # 3:  GLUCOSE INTOLERANCE (ICD-271.3)  per pt  - for a1c check today  Orders: TLB-A1C / Hgb A1C (Glycohemoglobin) (83036-A1C)  Problem # 4:  HYPERTENSION (ICD-401.9)  His updated medication list for this problem includes:    Diovan 40 Mg Tabs (Valsartan) .Marland Kitchen... 1po qd    Doxazosin Mesylate 8 Mg Tabs (Doxazosin mesylate) .Marland Kitchen... Take 1/2  tablet by mouth once a day    Carvedilol 25 Mg Tabs (Carvedilol) .Marland Kitchen... Take 1/2 tab qam and take 1/2 qhs  Orders: TLB-Udip w/ Micro (81001-URINE)  BP today: 102/58 Prior BP: 94/70 (07/16/2009)  Labs Reviewed: K+: 4.6 (10/14/2008) Creat: : 0.9 (10/14/2008)   Chol: 104 (10/14/2008)   HDL: 37.70 (10/14/2008)   LDL: 56 (10/14/2008)   TG: 52.0  (10/14/2008) stable overall by hx and exam, ok to continue meds/tx as is   Problem # 5:  HYPERLIPIDEMIA (ICD-272.4)  His updated medication list for this problem includes:    Niaspan 1000 Mg Tbcr (Niacin (antihyperlipidemic)) .Marland Kitchen... Take 2 tablet every night    Zocor 20 Mg Tabs (Simvastatin) .Marland Kitchen... Take 1 tablet by mouth every night  Labs Reviewed: SGOT: 31 (10/14/2008)   SGPT: 22 (10/14/2008)   HDL:37.70 (10/14/2008), 39.2 (04/14/2008)  LDL:56 (10/14/2008), 51 (04/14/2008)  Chol:104 (10/14/2008), 104 (04/14/2008)  Trig:52.0 (10/14/2008), 70 (04/14/2008) stable overall by hx and exam, ok to continue meds/tx as is , Pt to continue diet efforts, good med tolerance; to check labs - goal LDL less than 70   Orders: TLB-Lipid Panel (80061-LIPID)  Problem # 6:  FATIGUE (ICD-780.79)  minor, exam benign, to check labs below; follow with expectant management , also check BNP  Orders: TLB-BMP (Basic Metabolic Panel-BMET) (80048-METABOL) TLB-CBC Platelet - w/Differential (85025-CBCD) TLB-Hepatic/Liver Function Pnl (80076-HEPATIC) TLB-TSH (Thyroid Stimulating Hormone) (84443-TSH) TLB-Sedimentation Rate (ESR) (85652-ESR)  Complete Medication List: 1)  Niaspan 1000 Mg Tbcr (Niacin (antihyperlipidemic)) .... Take 2 tablet every night 2)  Plavix 75 Mg Tabs (Clopidogrel bisulfate) .... Take 1 tablet by mouth once a day 3)  Zocor 20 Mg Tabs (Simvastatin) .... Take 1 tablet by mouth every night 4)  Ecotrin Low Strength 81 Mg Tbec (Aspirin) .... Take 1 tablet by mouth once a day 5)  Diovan 40 Mg Tabs (Valsartan) .Marland Kitchen.. 1po qd 6)  Doxazosin Mesylate 8 Mg Tabs (Doxazosin mesylate) .... Take 1/2  tablet by mouth once  a day 7)  Carvedilol 25 Mg Tabs (Carvedilol) .... Take 1/2 tab qam and take 1/2 qhs 8)  Nitroglycerin 0.4 Mg Subl (Nitroglycerin) .... As needed as directed for chest pain 9)  Voltaren 1 % Gel (Diclofenac sodium) .... Apply 2 grams qid 10)  Travatan Z 0.004 % Soln (Travoprost) .... At  bedtime 11)  Bee Pollen 500 Mg Tabs (Bee pollen) .... Take 1 tablet by mouth two times a day 12)  Osteo Bi-flex Joint Shield Tabs (Misc natural products) .... Take 1 tablet by mouth two times a day 13)  Citalopram Hydrobromide 20 Mg Tabs (Citalopram hydrobromide) .... Take 1 tablet by mouth once a day 14)  Fexofenadine Hcl 180 Mg Tabs (Fexofenadine hcl) .... One by mouth once daily 15)  Meloxicam 15 Mg Tabs (Meloxicam) .Marland Kitchen.. 1po once daily as needed  Other Orders: TLB-BNP (B-Natriuretic Peptide) (83880-BNPR)  Patient Instructions: 1)  Please go to the Lab in the basement for your blood and/or urine tests today 2)  Please call the number on the [facility] Card for results of your testing 3)  Continue all previous medications as before this visit 4)  Please keep your appts with your specialists as you have planned 5)  please call if you want the aortic aneurysm screening done (the aortic ultrasound) 6)  your meloxicam and the voltaren are refilled today 7)  please have the pharmacy call if you need refills for other medications 8)  Please schedule a follow-up appointment in 1 year, or sooner if needed Prescriptions: MELOXICAM 15 MG TABS (MELOXICAM) 1po once daily as needed  #90 x 3   Entered and Authorized by:   Corwin Levins MD   Signed by:   Corwin Levins MD on 02/15/2010   Method used:   Electronically to        [facility]  [phone]* (retail)       [address]       [place], [place]  [address]       Ph: [phone] or [phone]       Fax: [phone]   RxID:   [id number] VOLTAREN 1 % GEL (DICLOFENAC SODIUM) apply 2 grams qid  ##5 x 3   Entered and Authorized by:   Corwin Levins MD   Signed by:   Corwin Levins MD on 02/15/2010   Method used:   Print then Give to Patient   RxID:   [id number]

## 2010-06-16 NOTE — Letter (Signed)
[facility]: [facility]   Imported By: Sherian Rein 05/17/2010 07:42:25  _____________________________________________________________________  External Attachment:    Type:   Image     Comment:   External Document

## 2010-06-16 NOTE — Assessment & Plan Note (Signed)
Summary: arm goes to sleep on & off x1 week- jr   Vital Signs:  Patient profile:   75 year old male Weight:      186.25 pounds BMI:     26.82 O2 Sat:      98 % on Room air Temp:     97.4 degrees F oral Pulse rate:   76 / minute Pulse rhythm:   regular Resp:     18 per minute BP sitting:   94 / 70  (right arm) Cuff size:   large  Vitals Entered By: Glendell Docker CMA (July 16, 2009 8:17 AM)  O2 Flow:  Room air CC: Rm 3-left arm falls asleep  Comments c/o left arm falling asleep for the past  2 weeks   Primary Care Provider:  DThomos Lemons DO  CC:  Rm 3-left arm falls asleep .  History of Present Illness: 75 y/o white male c/o left hand falling asleep.   occ upper arm discomfort no neck pain no upper ext weakness  Preventive Screening-Counseling & Management  Alcohol-Tobacco     Smoking Status: quit  Allergies (verified): No Known Drug Allergies  Past History:  Past Medical History: HYPERTENSION (ICD-401.9) HYPERLIPIDEMIA (ICD-272.4)  CORONARY ARTERY DISEASE (ICD-414.00) (November 2006 anterior myocardial     infarction.  Occluded right coronary artery.  He had stents placed     to his LAD.  His EF was 35%.  Last catheterization was in 2007.  He     had an LAD with 2 overlapping stents.  The mid vessel had 30%     stenosis.  There was a small diagonal.  This was jailed. The     circumflex had 40% ostial stenosis.  In the marginal, he had 30%     proximal stenosis.  The right coronary was occluded). CARDIOMYOPATHY (ICD-425.4)  (EF was 35% initially, increased to 54% on cath 07) URI (ICD-465.9) HEALTH MAINTENANCE EXAM (ICD-V70.0) DEGENERATIVE JOINT DISEASE, KNEES, BILATERAL (ICD-715.96) ANXIETY (ICD-300.00) ALLERGIC RHINITIS (ICD-477.9) MYOCARDIAL INFARCTION, HX OF (ICD-412) BENIGN PROSTATIC HYPERTROPHY (ICD-600.00) SINUSITIS- ACUTE-NOS (ICD-461.9) GLAUCOMA (ICD-365.9) left eye BENIGN PROSTATIC HYPERTROPHY, HX OF (ICD-V13.8) DEPRESSION  (ICD-311) HYPERCHOLESTEROLEMIA (ICD-272.0) OSTEOARTHRITIS (ICD-715.90) DJD - bilat knees    Past Surgical History: TURP - 05/15/1985    Family History: brother alive but had CVA this year, Brother died of alcoholic cirrhosis, Father MI at 55, Mother CVA at 23, one healthy brother, sister died of MS or ALS      Social History: lives alone, divorced in 1987, no kids, semi-retired truck Hospital doctor, 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   Lungs:  normal respiratory effort and normal breath sounds.   Heart:  normal rate, regular rhythm, no murmur, and no gallop.   Msk:  positive phalens test Neurologic:  cranial nerves II-XII intact, strength normal in all extremities, and DTRs symmetrical and normal.     Impression & Recommendations:  Problem # 1:  CARPAL TUNNEL SYNDROME, LEFT (ICD-354.0) Pt with left hand numbness.  I suspect CTS.  use wrist splint.   we reviewed stretching exercises.  If no improvement, refer to Dr. Teressa Senter  Complete Medication List: 1)  Niaspan 1000 Mg Tbcr (Niacin (antihyperlipidemic)) .... Take 2 tablet every night 2)  Plavix 75 Mg Tabs (Clopidogrel bisulfate) .... Take 1 tablet by mouth once a day 3)  Zocor 20 Mg Tabs (Simvastatin) .... Take 1 tablet by mouth every night 4)  Ecotrin Low Strength 81 Mg Tbec (Aspirin) .... Take 1 tablet by mouth once a day 5)  Diovan 40 Mg Tabs (Valsartan) .Marland Kitchen.. 1po qd 6)  Doxazosin Mesylate 8 Mg Tabs (Doxazosin mesylate) .... Take 1/2  tablet by mouth once a day 7)  Carvedilol 25 Mg Tabs (Carvedilol) .... Take 1/2 tab qam and take 1/2 qhs 8)  Nitroglycerin 0.4 Mg Subl (Nitroglycerin) .... As needed as directed for chest pain 9)  Voltaren 1 % Gel (Diclofenac sodium) .... Apply 2 grams qid 10)  Travatan Z 0.004 % Soln (Travoprost)  .... At bedtime 11)  Bee Pollen 500 Mg Tabs (Bee pollen) .... Take 1 tablet by mouth two times a day 12)  Osteo Bi-flex Joint Shield Tabs (Misc natural products) .... Take 1 tablet by mouth two times a day 13)  Citalopram Hydrobromide 20 Mg Tabs (Citalopram hydrobromide) .... 1/2 by mouth once daily x 7 days, then one by mouth once daily 14)  Fexofenadine Hcl 180 Mg Tabs (Fexofenadine hcl) .... One by mouth once daily  Patient Instructions: 1)  Use left wrist splint daily  2)  Perform stretching exercises as directed 3)  Please schedule a follow-up appointment in 1 month. Prescriptions: FEXOFENADINE HCL 180 MG TABS (FEXOFENADINE HCL) one by mouth once daily  #30 x 5   Entered and Authorized by:   D. Thomos Lemons DO   Signed by:   D. Thomos Lemons DO on 07/16/2009   Method used:   Electronically to        [facility]  [phone]* (retail)       [address]       [place], [place]  [address]       Ph: [phone] or [phone]       Fax: [phone]   RxID:   [id number] CITALOPRAM HYDROBROMIDE 20 MG TABS (CITALOPRAM HYDROBROMIDE) 1/2 by mouth once daily x 7 days, then one by mouth once daily  #30 x 0   Entered and Authorized by:   D. Thomos Lemons DO   Signed by:   D. Thomos Lemons DO on 07/16/2009   Method used:   Electronically to        [facility]  [phone]* (retail)       [address]       [place], [place]  [address]       Ph: [phone] or [phone]       Fax: [phone]   RxID:   [id number]   Current Allergies (reviewed today): No known allergies    Immunization History:  Influenza Immunization History:    Influenza:  historical (03/02/2009)

## 2010-06-16 NOTE — Progress Notes (Signed)
Summary: Citalopram  Refill  Phone Note Refill Request Message from:  Fax from Pharmacy on August 09, 2009 9:46 AM  Refills Requested: Medication #1:  CITALOPRAM HYDROBROMIDE 20 MG TABS 1/2 by mouth once daily x 7 days   Dosage confirmed as above?Dosage Confirmed   Brand Name Necessary? No   Supply Requested: 1 month   Last Refilled: 07/16/2009  Method Requested: Electronic Next Appointment Scheduled: 08-17-09 815 dR Renley Gutman  Initial call taken by: Roselle Locus,  August 09, 2009 9:46 AM    New/Updated Medications: CITALOPRAM HYDROBROMIDE 20 MG TABS (CITALOPRAM HYDROBROMIDE) Take 1 tablet by mouth once a day Prescriptions: CITALOPRAM HYDROBROMIDE 20 MG TABS (CITALOPRAM HYDROBROMIDE) Take 1 tablet by mouth once a day  #30 x 0   Entered by:   Glendell Docker CMA   Authorized by:   D. Thomos Lemons DO   Signed by:   Glendell Docker CMA on 08/09/2009   Method used:   Electronically to        [facility]  [phone]* (retail)       [address]       [place], [place]  [address]       Ph: [phone] or [phone]       Fax: [phone]   RxID:   [id number]

## 2010-06-16 NOTE — Progress Notes (Signed)
Summary: Citalopram refill  Phone Note Refill Request Message from:  Fax from Pharmacy on September 10, 2009 9:33 AM  Refills Requested: Medication #1:  CITALOPRAM HYDROBROMIDE 20 MG TABS Take 1 tablet by mouth once a day   Dosage confirmed as above?Dosage Confirmed Next Appointment Scheduled: none Initial call taken by: Lucious Groves,  September 10, 2009 9:33 AM  Follow-up for Phone Call        ok to refill citalopram x 1 needs OV for additional refills Follow-up by: D. Thomos Lemons DO,  September 10, 2009 2:35 PM  Additional Follow-up for Phone Call Additional follow up Details #1::        phoned in. Additional Follow-up by: Lucious Groves,  September 10, 2009 2:55 PM    Prescriptions: CITALOPRAM HYDROBROMIDE 20 MG TABS (CITALOPRAM HYDROBROMIDE) Take 1 tablet by mouth once a day  #30 x 0   Entered by:   Lucious Groves   Authorized by:   D. Thomos Lemons DO   Signed by:   Lucious Groves on 09/10/2009   Method used:   Telephoned to ...       [facility]  [address]  [phone]* (retail)       [address]       [place], [place]  [address]       Ph: [phone] or [phone]       Fax: [phone]   RxID:   [id number]

## 2010-06-16 NOTE — Assessment & Plan Note (Signed)
Summary: 3 yr 414.01   pfh,rn      Allergies Added: NKDA  Visit Type:  Follow-up Primary Provider:  Dondra Spry DO  CC:  CAD.  History of Present Illness: The patient presents for yearly followup. Since I last saw him he has done extremely well. He denies any chest pressure, neck or arm discomfort. He has no shortness of breath, PND or orthopnea. He is walking daily for exercise. He has no palpitations, presyncope or syncope. He has had no weight gain or edema.  Current Medications (verified): 1)  Niaspan 1000 Mg Tbcr (Niacin (Antihyperlipidemic)) .... Take 2 Tablet Every Night 2)  Plavix 75 Mg Tabs (Clopidogrel Bisulfate) .... Take 1 Tablet By Mouth Once A Day 3)  Zocor 20 Mg Tabs (Simvastatin) .... Take 1 Tablet By Mouth Every Night 4)  Ecotrin Low Strength 81 Mg  Tbec (Aspirin) .... Take 1 Tablet By Mouth Once A Day 5)  Diovan 40 Mg  Tabs (Valsartan) .Marland Kitchen.. 1po Qd 6)  Doxazosin Mesylate 8 Mg  Tabs (Doxazosin Mesylate) .... Take 1/2  Tablet By Mouth Once A Day 7)  Carvedilol 25 Mg  Tabs (Carvedilol) .... Take 1/2 Tab Qam and Take 1/2 Qhs 8)  Nitroglycerin 0.4 Mg Subl (Nitroglycerin) .... As Needed As Directed For Chest Pain 9)  Voltaren 1 % Gel (Diclofenac Sodium) .... Apply 2 Grams Qid 10)  Travatan Z 0.004 % Soln (Travoprost) .... At Bedtime 11)  Bee Pollen 500 Mg Tabs (Bee Pollen) .... Take 1 Tablet By Mouth Two Times A Day 12)  Osteo Bi-Flex Joint Shield  Tabs (Misc Natural Products) .... Take 1 Tablet By Mouth Two Times A Day  Allergies (verified): No Known Drug Allergies  Past History:  Past Medical History: Reviewed history from 02/15/2010 and no changes required. HYPERTENSION (ICD-401.9) HYPERLIPIDEMIA (ICD-272.4)  CORONARY ARTERY DISEASE (ICD-414.00) (November 2006 anterior myocardial     infarction.  Occluded right coronary artery.  He had stents placed     to his LAD.  His EF was 35%.  Last catheterization was in 2007.  He     had an LAD with 2 overlapping stents.   The mid vessel had 30%     stenosis.  There was a small diagonal.  This was jailed. The     circumflex had 40% ostial stenosis.  In the marginal, he had 30%     proximal stenosis.  The right coronary was occluded). CARDIOMYOPATHY (ICD-425.4)  (EF was 35% initially, increased to 54% on cath 07) URI (ICD-465.9) HEALTH MAINTENANCE EXAM (ICD-V70.0) DEGENERATIVE JOINT DISEASE, KNEES, BILATERAL (ICD-715.96) ANXIETY (ICD-300.00) ALLERGIC RHINITIS (ICD-477.9) MYOCARDIAL INFARCTION, HX OF (ICD-412) BENIGN PROSTATIC HYPERTROPHY (ICD-600.00) SINUSITIS- ACUTE-NOS (ICD-461.9) GLAUCOMA (ICD-365.9) left eye BENIGN PROSTATIC HYPERTROPHY, HX OF (ICD-V13.8) DEPRESSION (ICD-311) HYPERCHOLESTEROLEMIA (ICD-272.0) OSTEOARTHRITIS (ICD-715.90) DJD - bilat knees MD roster:    optho- [facility] on [address]                     urology - Dr Retta Diones                      card -  Dr Ted Mcalpine - Dr Elmon Else                      Past Surgical History: Reviewed history from 07/16/2009 and no changes required. TURP -  05/15/1985    Review of Systems       As stated in the HPI and negative for all other systems.   Vital Signs:  Patient profile:   75 year old male Height:      70 inches Weight:      178 pounds BMI:     25.63 Pulse rate:   66 / minute Resp:     16 per minute BP sitting:   102 / 72  (right arm)  Vitals Entered By: Marrion Coy, CNA (March 07, 2010 8:43 AM)  Physical Exam  General:  Well developed, well nourished, in no acute distress. Head:  normocephalic and atraumatic Eyes:  PERRLA/EOM intact; conjunctiva and lids normal. Mouth:  Teeth, gums and palate normal. Oral mucosa normal. Neck:  Neck supple, no JVD. No masses, thyromegaly or abnormal cervical nodes. Chest Wall:  no deformities or breast masses noted Lungs:  Clear bilaterally to auscultation and percussion. Abdomen:  Bowel sounds positive; abdomen soft and non-tender without masses,  organomegaly, or hernias noted. No hepatosplenomegaly. Msk:  Back normal, normal gait. Muscle strength and tone normal. Extremities:  No clubbing or cyanosis. Neurologic:  Alert and oriented x 3. Skin:  Intact without lesions or rashes. Cervical Nodes:  no significant adenopathy Inguinal Nodes:  no significant adenopathy Psych:  Normal affect.   Detailed Cardiovascular Exam  Neck    Carotids: Carotids full and equal bilaterally without bruits.      Neck Veins: Normal, no JVD.    Heart    Inspection: no deformities or lifts noted.      Palpation: normal PMI with no thrills palpable.      Auscultation: regular rate and rhythm, S1, S2 without murmurs, rubs, gallops, or clicks.    Vascular    Abdominal Aorta: no palpable masses, pulsations, or audible bruits.      Femoral Pulses: normal femoral pulses bilaterally.      Pedal Pulses: normal pedal pulses bilaterally.      Radial Pulses: normal radial pulses bilaterally.      Peripheral Circulation: no clubbing, cyanosis, or edema noted with normal capillary refill.     EKG  Procedure date:  03/07/2010  Findings:      Reviewed today and reported elsewhere  Impression & Recommendations:  Problem # 1:  CORONARY ARTERY DISEASE (ICD-414.00) He is doing well and we will continue with risk reduction. No further testing is suggested. Orders: EKG w/ Interpretation (93000)  Problem # 2:  HYPERTENSION (ICD-401.9) His blood pressure is excellent. He will continue the meds as listed.  Problem # 3:  HYPERLIPIDEMIA (ICD-272.4) I reviewed his lipid profile with him today. His LDL is 55 and HDL 43.5 earlier this year. He will continue the meds as listed.  Patient Instructions: 1)  Your physician recommends that you schedule a follow-up appointment in: 12 months with Dr Antoine Poche 2)  Your physician recommends that you continue on your current medications as directed. Please refer to the Current Medication list given to you today.

## 2010-09-01 ENCOUNTER — Other Ambulatory Visit: Payer: Self-pay | Admitting: Cardiology

## 2010-09-27 NOTE — Assessment & Plan Note (Signed)
[facility]                            CARDIOLOGY OFFICE NOTE   Juan Mcdaniel, Juan Mcdaniel                    MRN:          [id number]  DATE:07/18/2007                            DOB:          02-Nov-1935    PRIMARY CARE PHYSICIAN:  Barbette Hair. Artist Pais, DO   REASON FOR PRESENTATION:  Evaluate patient with coronary artery disease.   HISTORY OF PRESENT ILLNESS:  The patient is a pleasant 75 year old  gentleman previously seen by Dr. Samule Ohm.  He has a history of coronary  disease dating back to November 2006 when he had an anterior myocardial  infarction.  At that time he was found to have an occluded right  coronary.  He had stents x2 placed into his LAD.  He had an EF of 35%.  He subsequently had improvement in his EF on followup echocardiography.  In 2007, it was low and normal.  His most recent catheterization was in  December 2007.  He had an EF of 54% with apical akinesis.  The LAD had  two overlapping stents in the proximal mid vessel with 30% stenosis just  distal to the stent. There is a small diagonal that was jailed,  circumflex had a 40% ostial stenosis and a large marginal had 30%  proximal stenosis, right coronary artery was occluded with good  collaterals.  The EF is 54%.   He says he has done well.  He has not seen Dr. Samule Ohm in a year.  He has  not exercised as much as he should.  Has gained weight from eating too  much.  However, with activity he denies any chest discomfort, neck or  arm discomfort.  He has had no palpitations, pre-syncope or syncope.  Had no PND or orthopnea.  His blood pressure was low and he was feeling  dizzy.  On his own he stopped his Diovan.   PAST MEDICAL HISTORY:  1. Coronary artery disease as described.  2. Hyperlipidemia.  3. Benign prostatic hypertrophy.  4. Glaucoma.  5. Osteoarthritis.  6. TURP.   ALLERGIES/INTOLERANCES:  None.   MEDICATIONS:  1. Plavix 75 mg daily.  2. Spironolactone 25 mg daily.  3.  Carvedilol 12.5 mg b.i.d.  4. Garlic.  5. Vitamin C.  6. Simvastatin 20 mg daily.  7. Doxazosin.  8. Niaspan 1000 mg daily.  9. Aspirin 81 mg daily.  10.Zyrtec 10 mg daily.  11.Xalatan.   REVIEW OF SYSTEMS:  As stated in the HPI, otherwise negative for other  systems.   PHYSICAL EXAMINATION:  The patient is in no distress.  Blood pressure  104/68, heart rate 63 and regular, weight 193 pounds, body mass index  28.  HEENT: Eyelids unremarkable. Pupils equal, round, and reactive to light.  Fundi not visualized. Oral mucosa is unremarkable.  NECK: No jugular venous distention at 45 degrees. Carotid upstroke brisk  and symmetrical. No bruits, no thyromegaly.  LYMPHATICS: No cervical, axillary or inguinal adenopathy.  LUNGS: Clear to auscultation bilaterally.  BACK: No costovertebral angle tenderness.  CHEST: Unremarkable.  HEART: PMI not displaced or sustained. S1, S2 within normal limits. No  S3. No S4. No clicks, rub or murmurs.  ABDOMEN: Mildly obese, positive bowel sounds, normal in frequency and  pitch. No bruits. No rebound. No guarding. No midline pulsatile mass. No  hepatomegaly, splenomegaly.  SKIN: No rashes, no nodules.  EXTREMITIES: 2+ pulses throughout. No edema.   EKG:  Sinus rhythm, rate 63, axis within normal limits, old anteroseptal  infarct, no acute ST-T wave change.   ASSESSMENT/PLAN:  1. Coronary disease.  Patient is having no ongoing symptoms.  No      further cardiovascular testing is suggested.  He needs aggressive      risk reduction.  2. Dyslipidemia.  He had a reasonable lipid profile, but it has not      been checked in the year.  I will take the liberty of ordering      lipid profile, liver enzymes.  He will come back fasting for that.  3. Cardiomyopathy.  The patient's ejection fraction had improved.  He      was lightheaded and stopped his Diovan.  I rather he took his      Diovan and beta blocker and stop the spironolactone.  Therefore, I       will make this change.  He is going to restart the Diovan at 40 mg      daily and stop the spironolactone.  He will continue the carvedilol      at the current dose.  4. Risk reduction as above.  Will check the lipids.  We discussed at      length exercise and I gave him some specific instructions on this.      We discussed at length weight loss and I gave him some specific      goals.  5. Follow-up.  I will  see the patient again in 6 months or sooner if      needed.     Rollene Rotunda, MD, [facility]  Electronically Signed   JH/MedQ  DD: 07/18/2007  DT: 07/18/2007  Job #: [id number]   cc:   Barbette Hair. Artist Pais, DO

## 2010-09-27 NOTE — Assessment & Plan Note (Signed)
[facility]                            CARDIOLOGY OFFICE NOTE   TRAMMELL, BOWDEN                    MRN:          [id number]  DATE:01/21/2008                            DOB:          Feb 14, 1936    PRIMARY CARE PHYSICIAN:  Barbette Hair. Artist Pais, DO   REASON FOR PRESENTATION:  Evaluate the patient with coronary artery  disease.   HISTORY OF PRESENT ILLNESS:  The patient is 75 years old.  He presents  for followup of the above.  He has done well since I last saw him.  He  has been exercising twice a day.  With this, he denies any chest  discomfort, neck, or arm discomfort.  He has had no palpitation,  presyncope, or syncope.  He has had no PND or orthopnea.  He has been  taking his medicines as prescribed.  He is slightly hypotensive.  He  will occasionally give lightheaded when he steps out of his truck for  instance, but he is not having any other orthostatic symptoms or adverse  symptoms related to his low blood pressure.   Off note, he did have his lipids checked after the last visit and his  LDL was 69.  His total was 119.  His HDL was 35.6.  Triglycerides were  72.   PAST MEDICAL HISTORY:  1. Coronary artery disease (November 2006 anterior myocardial      infarction.  Occluded right coronary artery.  He had stents placed      to his LAD.  His EF was 35%.  Last catheterization was in 2007.  He      had an LAD with 2 overlapping stents.  The mid vessel had 30%      stenosis.  There was a small diagonal.  This was JL.  The      circumflex had 40% ostial stenosis.  In the marginal, he had 30%      proximal stenosis.  The right coronary was occluded).  2. Cardiomyopathy (EF had been 35% at the time of his MI.  It was up      to 54% in the last cycle).  3. Hyperlipidemia.  4. Benign prostatic hypertrophy.  5. Glaucoma.  6. Osteoarthritis.  7. TURP.   ALLERGIES:  None.   MEDICATIONS:  1. Plavix 75 mg daily.  2. Coreg 12.5 mg b.i.d.  3.  Simvastatin 20 mg daily.  4. Doxazosin 8 mg daily.  5. Niaspan 1000 mg daily.  6. Aspirin 81 mg daily.  7. Diovan 40 mg daily.  8. Mucinex.  9. Meloxicam.  10.Travatan.   REVIEW OF SYSTEMS:  As stated in the HPI and otherwise negative for  other systems.   PHYSICAL EXAMINATION:  GENERAL:  The patient is pleasant and has been in  no distress.  VITAL SIGNS:  Blood pressure 94/67, heart rate 70 and irregular, weight  190 pounds, and body mass index 27.  HEENT:  Eyes unremarkable.  Pupils equal, round, and reactive to light.  Fundi not visualized.  Oral mucosa unremarkable.  NECK:  No jugular venous distension at 45 degrees, carotid  upstroke  brisk and symmetrical.  No bruits.  No thyromegaly  LYMPHATIC:  No cervical, axillary, or inguinal adenopathy.  LUNGS:  Clear to auscultation bilaterally.  BACK:  No costovertebral angle tenderness.  CHEST:  Unremarkable.  HEART:  PMI not displaced or sustained.  S1 and S2 are within normal  limits.  No S3, no S4.  No clicks, no rubs, no murmurs.  ABDOMEN:  Mildly obese, positive bowel sounds, normal in frequency and  pitch.  No bruits, no rebound, no guarding.  No midline pulsatile mass.  No hepatomegaly.  No splenomegaly.  SKIN:  No rashes, no nodules.  EXTREMITIES:  A 2+ pulses throughout, no edema.  No cyanosis.  No  clubbing.  NEURO:  Oriented to person, place, and time.  Cranial nerves II through  XII grossly intact.  Motor grossly intact.   EKG sinus bradycardia, rate of 57, axis within normal limits.  Intervals  within normal limits.  Poor anterior R-wave progression.   ASSESSMENT AND PLAN:  1. Coronary artery disease.  The patient is having no new symptoms.      No further cardiovascular testing is suggested.  He will continue      with risk reduction.  2. Dyslipidemia.  We had a long discussion about the fact that his HDL      is still not at target.  However, in January 2008, Dr. Samule Ohm      reduced his Niaspan.  At that time,  his HDL was very slightly      better than it is now.  The Niaspan was reduced because of cost.      He is very reluctant to go back up and we did not get a lot of      bang for the above.  Therefore, I will leave at his current dose,      but we will repeat a lipid and liver today.  3. Hypotension.  The patient runs a blood pressure with some mild      symptoms.  I told him if he gets more symptomatic in the future, we      would back off.  We did discontinue the spironolactone last time.      I would like him to talk to his urologist about whether he can      reduce the dose of doxazosin as this may contribute also to      hypotension.  4. Cardiomyopathy.  He will continue the medicines as listed above.  5. Benign prostatic hypertrophy, as above.   FOLLOWUP:  I will see him back in 6 months or sooner if needed.     Rollene Rotunda, MD, [facility]  Electronically Signed    JH/MedQ  DD: 01/21/2008  DT: 01/22/2008  Job #: [id number]   cc:   Barbette Hair. Artist Pais, DO

## 2010-09-30 NOTE — H&P (Signed)
NAMECORDERIUS, SARACENI               ACCOUNT NO.:  [id number]   MEDICAL RECORD NO.:  [id number]          PATIENT TYPE:  INP   LOCATION:  2912                         FACILITY:  [facility]   PHYSICIAN:  Arvilla Meres, M.D. LHCDATE OF BIRTH:  May 05, 1936   DATE OF ADMISSION:  04/12/2005  DATE OF DISCHARGE:                                HISTORY & PHYSICAL   PRIMARY CARE PHYSICIAN:  None.   CARDIOLOGIST:  He is new to [facility].   REASON FOR ADMISSION:  Acute anterior septal myocardial infarction.   Mr. Haberman is a very pleasant 75 year old male with minimal past medical  history including glaucoma and BPH but no known history of cardiac disease  who presents with an acute anterior septal MI.   He was in his usual state of health until this evening when he woke up from  sleep at 11 p.m. with 6/10 anterior chest pain radiating to both arms. This  was persistent and he began to feel ill, so he called EMS. On EMS arrival,  EKG showed anterior septal ST elevation. He was treated with aspirin,  sublingual nitroglycerin x2, and metoprolol on the way to the ER. On arrival  to ER he still complained of some mild chest pain but with significant  relief. He was started on heparin and IV nitroglycerin. The pain is now 1/10  and he appears much more comfortable.   He denies any history of cardiac catheterization. He has never had previous  chest pain or heart failure. 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 He denies any claudication or  neurologic symptoms. He has not had any fevers or chills. There has been no  melena or bright red blood per rectum. He has not had any significant change  in his bowel or bladder habits.   REVIEW OF SYSTEMS:  As per HPI and problem list. Otherwise, all systems  negative.   PAST MEDICAL HISTORY:  1.  Glaucoma.  2.  BPH.  3.  Remote tobacco use, quit in 1993.  4.  Osteoarthritis.   CURRENT  MEDICATIONS:  Avodart and Flomax. Also aspirin p.r.n. and Xalatan  one drop to the left eye a day.   SOCIAL HISTORY:  He is divorced. He lives in [place]. 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 Has a history of one to  two packs of cigarettes a day off and on for 30 years, quit in 1993. No  significant alcohol or drug use.   FAMILY HISTORY:  His father died from an MI at age 54. Mother died at age 82  from complications from a stroke.   PHYSICAL EXAMINATION:  GENERAL:  He is lying flat in bed. He is comfortable.  VITAL SIGNS:  Afebrile. Respirations are unlabored. Blood pressure is 110/75  with a heart rate of 72. He is saturating 93% on 2 L.  HEENT:  Sclerae anicteric, EOMI. There are no xanthelasmas. Mucous membranes  are moist.  NECK:  Supple. There is no JVD. Carotids are 2+ bilaterally  without any  bruits. There is no lymphadenopathy or thyromegaly.  CARDIAC:  He has a regular rate and rhythm with no murmurs, rubs, or  gallops.  LUNGS:  Clear to auscultation.  ABDOMEN:  Mildly obese, soft, nontender, nondistended. There is no  hepatosplenomegaly, no bruits, no masses. There are good bowel sounds.  EXTREMITIES:  Warm but pale. Femoral pulses are 2+ bilaterally without any  bruits. PT pulses are 2+ bilaterally. DP pulses are 2+ on the right and  nonpalpable on the left.  NEUROLOGIC:  Cranial nerves II-XII are intact. He moves all four extremities  without difficulty. He is alert and oriented x3 and has an appropriate  affect.   LABORATORY DATA:  Pending. Chest x-ray is pending. EKG shows normal sinus  rhythm at a rate of 80. There are Q waves in V1 and V2 with 1-3 mm ST  elevation in V1 through V3, and perhaps minimal elevation in V4.   ASSESSMENT AND PLAN:  Acute anterior septal myocardial infarction. Symptoms  much improved with current therapy. Will take him urgently to the  catheterization laboratory with Dr. Geralynn Rile. We will continue  risk  factor management.      Arvilla Meres, M.D. [facility]  Electronically Signed     DB/MEDQ  D:  04/13/2005  T:  04/13/2005  Job:  [id number]

## 2010-09-30 NOTE — Discharge Summary (Signed)
NAMEHARTMAN, MINAHAN               ACCOUNT NO.:  [id number]   MEDICAL RECORD NO.:  [id number]          PATIENT TYPE:  INP   LOCATION:  3701                         FACILITY:  [facility]   PHYSICIAN:  Salvadore Farber, M.D. LHCDATE OF BIRTH:  09-Jan-1936   DATE OF ADMISSION:  04/12/2005  DATE OF DISCHARGE:  04/17/2005                                 DISCHARGE SUMMARY   TIME OF DISCHARGE:  38 minutes.   PRIMARY DIAGNOSES:  1.  Anterior wall ST-segment elevation myocardial infarction, status post      Horizon stent to the left anterior descending.  2.  Residual coronary artery disease in the right coronary artery which was      totaled, medical therapy recommended.  3.  Left ventricular dysfunction with an ejection fraction of 35%,      anterolateral and apical akinesis, but no atherosclerosis or mitral      regurgitation.  4.  Hyperlipidemia with a total cholesterol of 224, triglycerides 97, HDL      43, LDL 162.  5.  Benign prostatic hypertrophy.  6.  Diarrhea in the hospital, likely secondary to Protonix.  7.  Status post transurethral resection of the prostate.  8.  History of glaucoma in the left eye.   PRIMARY CARDIOLOGIST:  Dr. Salvadore Farber, new.   PRIMARY CARE PHYSICIAN:  None.   PROCEDURE:  1.  Cardiac catheterization.  2.  Coronary arteriogram.  3.  Left ventriculogram.  4.  Horizon study stent times two to the proximal LAD.  5.  Star Close of the right CFA site.   HOSPITAL COURSE:  [name] is a 75 year old male with no known history of  coronary artery disease. He woke on the night of admission at 11 p.m. with  6/10 anterior chest pain. EMS was called and his EKG was abnormal. He  received sublingual nitroglycerin times two, aspirin, and metoprolol.   Her symptoms greatly improved, but his chest pain did not resolve and he was  taken to the cath lab.   His cardiac catheterization showed a total proximal LAD that was treated  with PTCA and Horizon stent times  two. The first diagonal was totaled as  well with collateral flow. He had a 30% circumflex and a 40% OM, which were  non-flow limiting. The RCA was totaled in the mid portion with good  collateral circulation from the circumflex. His EF was 35%.   [name] had some hyperglycemia. A hemoglobin A1c was checked and was  within normal limits at 5.2. His peak CK-MB was 4035/362 with a peak  troponin of 54.26.  A lipid profile was checked and because of his elevated  LDL and MI he was started on 80 mg Lipitor. A CRP was checked and was low at  0.1.   [name] does not smoke. He was seen by cardiac rehab and is to follow up  with them as an outpatient.   [name] recovered well over the next few days. He had aldactone, Coreg,  Lasix, and an ACE inhibitor added to his medication regimen. He had been on  Flomax prior to this admission, but this was discontinued. However, he is to  continue on the Avodart. His blood pressure was well controlled. He was  ambulating well with cardiac rehab also. He had some diarrhea, but had been  started Protonix. The Protonix was discontinued and so far he has remained  asymptomatic.   Pending evaluation by Dr. Samule Ohm, Mr. Withrow is tentatively considered  stable for discharge on April 17, 2005, with outpatient follow-up  arranged. Of note his potassium was briefly low at 3.4 and this was  supplemented. He is to get a follow-up BMET in the office as an outpatient  as he has been started on a diuretic ACE inhibitor and a potassium-sparing  diuretic as well.   DISCHARGE INSTRUCTIONS:  His activity level is to be increased gradually and  is to be per cardiac rehab guidelines. He is to weigh himself daily and  report 5 pound gains. He is to call our office for problems with the cath  site. He is to follow up with Dr. Melinda Crutch PA/NP on December 18th at 12:45.  He is not to take Flomax for now.   DISCHARGE MEDICATIONS:  1.  Plavix 75 mg daily.  2.   Aspirin 325 mg daily.  3.  Nitroglycerin sublingual p.r.n.  4.  Lipitor 80 mg daily.  5.  Colace 100 mg one or two tabs daily p.r.n.  6.  Avodart 0.5 mg daily.  7.  Coreg 6.25 mg b.i.d.  8.  Aldactone 25 mg daily.  9.  Vasotec 10 mg daily.  10. Furosemide 20 mg daily.  11. Xalatan eye drops as prior to admission.  12. He is not to take Flomax.      Theodore Demark, P.A. [facility]      Salvadore Farber, M.D. [facility]  Electronically Signed    RB/MEDQ  D:  04/17/2005  T:  04/17/2005  Job:  [id number]   cc:   Salvadore Farber, M.D. [facility]  [address]  [address]  [place]  [place] [address]

## 2010-09-30 NOTE — Cardiovascular Report (Signed)
NAMEPANAYIOTIS, RAINVILLE               ACCOUNT NO.:  [id number]   MEDICAL RECORD NO.:  [id number]          PATIENT TYPE:  INP   LOCATION:  2912                         FACILITY:  [facility]   PHYSICIAN:  Salvadore Farber, M.D. LHCDATE OF BIRTH:  [birth year]   DATE OF PROCEDURE:  04/13/2005  DATE OF DISCHARGE:                              CARDIAC CATHETERIZATION   PROCEDURES:  Left heart catheterization, left ventriculography, coronary  angiography, HORIZON study, stent x2 to the proximal left anterior  descending, StarClose closure of the right common femoral arteriotomy site.   INDICATIONS:  Juan Mcdaniel is a 75 year old gentleman without prior history of  cardiovascular disease. He presents having developed acute onset of  substernal chest discomfort at 11:00 p.m. this evening. He arrived at the  emergency room at 2335 with ongoing symptoms. Electrocardiogram demonstrated  anterior ST elevations. He was seen by Dr. Gala Romney and referred for urgent  catheterization with an eye to percutaneous coronary revascularization.   PROCEDURAL TECHNIQUE:  Informed consent was obtained. The patient consented  to participate in the Horizon arise in HORIZON trial, comparing bare metal  stent with drug-eluting stent as well as bivalirudin versus heparin plus  glycoprotein to 2B3A inhibitor and acute myocardial infarction.   Under 1% lidocaine local anesthesia, a 6-French sheath was placed in the  right common femoral artery using the modified Seldinger technique.  Diagnostic angiography and ventriculography were performed using JL-4, JR-4,  and pigtail catheters. Images demonstrated a chronic total occlusion of the  RCA with robust collaterals from the circumflex. The culprit lesion was an  occlusion of the proximal LAD. There was minimal collateralization of the  distal LAD and a large diagonal. A decision was made to proceed to  percutaneous revascularization.   Anticoagulation was initiated with  bivalirudin per randomization in the  HORIZON study. Then 600 mg of oral Plavix was administered as well as 325 mg  of aspirin. A 6-French CLS 3.5 guide was advanced over a wire and engaged in  the ostium of the left main. A Prowater wire was advanced into the distal  LAD without difficulty. A 2.5 x 15-mm Maverick was advanced over the wire  into the mid-portion of the LAD. Via this, 1200 mcg of intracoronary  adenosine was administered. The wire was then replaced into the distal LAD.  The balloon was pulled back and used to predilate the occlusion at 6  atmospheres. With this, reperfusion was established at 0100. The lesion was  then stented using a 2.5 x 24-mm HORIZON study stent. With stenting, there  was severe no reflow with flow dropping from the TIMI-III that had been  established after predilation to TIMI-0.  There was no response to  intracoronary adenosine administered via the Guidant catheter. Therefore,  the over-the wire-balloon was re-introduced into the mid-LAD. The wire was  removed. Intracoronary adenosine in boluses of 600 mcg to a total of an  additional 2.4 mg was administered via the balloon lumen. This resulted in  restoration of sustained TIMI-III flow. With this improvement of flow, it  became clear that there remained severe stenosis distal to  the stented  segment. This was then treated with an additional 2.5 x 20-mm HORIZON study  stent laid to overlap the distal portion of the previously-placed stent by  approximately 3 mm. It was deployed at 14 atmospheres. The entirety of both  stents was then post-dilated using a 2.5 x 18-mm PowerSail. This was  inflated to 16 atmospheres most distally and then 18 atmospheres throughout  the remainder of the stented segments. Flow remained TIMI-III. There was,  however, severe spasm of the LAD. There was minimal response of this to  intracoronary adenosine.  However, some improvement was apparent by the  conclusion of the  case.   Final angiography demonstrated no residual stenosis, TIMI-III flow to the  distal vasculature, and a TIMI blush score of 3. The patient was pain free  at the completion of the procedure.   The arteriotomy was then closed using a StarClose device. Complete  hemostasis was obtained. He was then transferred to the cardiac intensive  care unit in stable condition.   COMPLICATIONS:  Transient no reflow which responded to intracoronary  adenosine injections.   FINDINGS:  1.  LV:  119/20/35.  EF 35% with anterolateral and apical akinesis.  2.  No aortic stenosis or mitral regurgitation.  3.  Left main:  Angiographically normal.  4.  LAD:  The LAD was occluded approximately 3 mm in from its ostium. It      gives rise to a small first and apparently larger second diagonal. The      second diagonal is occluded and fills via collaterals from the      circumflex. The lesions in the distal LAD are difficult to assess given      significant spasm throughout the case. However, there are clearly no      severe stenosis distal to the stented segments.  5.  Circumflex:  Fairly large vessel giving rise to two obtuse marginals.      The first marginal has a 40% stenosis. The remainder has luminal      irregularities. There is a 30% stenosis in the ostium of the circumflex.  6.  RCA:  The vessel was occluded after the takeoff of the first acute      marginal. Very distal vessel is robustly collateralized from the      circumflex.   IMPRESSION/PLAN:  Successful of percutaneous revascularization of the  culprit left anterior descending  occlusion. Revascularization was  complicated by no reflow which responded to intracoronary adenosine  administration. Will plan conservative management of the right coronary  artery occlusion which is clearly chronic and is well collateralized.   He will need to be treated very aggressively for his left ventricular dysfunction. Plavix and aspirin will be  administered indefinitely.  Carvedilol and ACE inhibitor will be initiated. Gentle diuresis will be  given due to his markedly elevated left ventricular end-diastolic pressure.      Salvadore Farber, M.D. [facility]  Electronically Signed     WED/MEDQ  D:  04/13/2005  T:  04/13/2005  Job:  [id number]

## 2010-09-30 NOTE — Assessment & Plan Note (Signed)
[facility]                            CARDIOLOGY OFFICE NOTE   Juan Mcdaniel, Juan Mcdaniel                    MRN:          [id number]  DATE:05/22/2006                            DOB:          17-May-1935    Patient seen in [facility] clinic on May 22, 2006 for Dr. Juanito Doom.  This is a patient of Dr. Samule Ohm and Dr. Thomos Lemons, his primary  physician.   This is postcath followup.  This is a 75 year old, divorced, white male  patient who was hospitalized for 1 year, Horizon followup, cardiac cath.  The patient had an anterior wall MI in November of 2006, with subsequent  stenting of the LAD with 2 Horizon study stents.  At that time, he had  chronic total occlusion of the mid RCA with faint right-to-left  collaterals.  Repeat cardiac cath on December 18 by Dr. Samule Ohm showed a  widely patent Horizon stent x2, and he continued to have a chronic total  occlusion of the mid RCA with faint right-to-left bridging collaterals,  as well as left-to-right collaterals.  His EF was improved at 54%, up  from 35%.  Patient was discharged home in stable condition.   Patient has had no chest pain or cardiac symptoms since his MI.  He has  done quite well.  He is complaining of a lot of bruising, and is  wondering if he could decrease his aspirin to 81 mg a day, and he is  also complaining about the cost of Niaspan, and would like to cut this  back.   CURRENT MEDICATIONS:  1. Plavix 75 mg daily.  2. Aspirin 325 daily.  3. Aldactone 25 mg daily.  4. Xalatan eye drops to the left eye.  5. Vasotec 5 mg daily.  6. Coreg 12.5 mg b.i.d.  7. Niaspan 2000 mg daily.  8. Zocor 20 mg nightly.  9. Flomax 0.4 mg daily.   PHYSICAL EXAMINATION:  This is a very pleasant 75 year old white male in  no acute distress.  Blood pressure 90/70, pulse 80, weight 177.  NECK:  Without JVD, HJR, bruit or thyroid enlargement.  LUNGS:  Clear anterior, posterior, and lateral.  HEART:   Regular rate and rhythm at 80 beats per minute.  Normal S1 and  S2.  Positive S4.  No significant rub, murmur, bruit, thrill, or heave.  ABDOMEN:  Soft without organomegaly, masses, lesions or abnormal  tenderness.  EXTREMITIES:  Right groin is without hematoma or hemorrhage.  Extremities without cyanosis, clubbing, or edema.  He has good distal  pulses.   IMPRESSION:  1. Cardiac catheterization revealed widely patent Horizon stent x2      with chronic total occlusion of the mid RCA with faint right-to-      left bridging collaterals and left-to-right collaterals, improved      ejection fraction, 54%.  2. Status post anterior wall myocardial infarction in November, 2006,      treated with stenting to the left anterior descending with 2      Horizon stents.  3. Hyperlipidemia.  Last lipid panel in August 2007.  Cholesterol 87,  triglycerides 43, HDL 35, LDL 43.  4. Benign prostatic hypertrophy.  5. Left nipple mass, benign.  6. Glaucoma.   PLAN:  At this time, I have told the patient he can cut his Niaspan back  to 1000 mg a day.  We have given him samples, and I will repeat a  fasting lipid panel in 6 weeks.  I have asked him to continue on 325 mg  of aspirin a day, and discuss both of these adjustments with Dr. Samule Ohm  when he sees him back in 2 months.      Jacolyn Reedy, PA-C  Electronically Signed      Jesse Sans. Daleen Squibb, MD, [facility]  Electronically Signed   ML/MedQ  DD: 05/22/2006  DT: 05/22/2006  Job #: [id number]   cc:   Barbette Hair. Artist Pais, DO

## 2010-09-30 NOTE — Assessment & Plan Note (Signed)
[facility]                               PULMONARY OFFICE NOTE   Juan, Mcdaniel                    MRN:          [id number]  DATE:01/11/2006                            DOB:          1936-04-28    INDICATIONS FOR CONSULT:  This is a 75 year old white male with history of  cough and bronchitis, potential hemoptysis, who has been short of breath for  six months.  The patient smoked a pack a day for 15 years and quit in 1993.  The patient had blood-streaked mucus and now is cleared.  He had a CT of the  chest done which showed no active process.  He denies chest pain or  wheezing. He was somewhat short of breath at rest because he was stopped up  and now is better since finishing a second round of antibiotics.  He  initially received the doxycycline then received Keflex.  He is on no  inhaled medications at this time.   PAST HISTORY:  He has history of MI in 2006 with two PTCAs.  He has no  history of asthma, emphysema, diabetes, or increased cholesterol.  He did  have allergies in the past. He had prostatic surgery in 1987.  No other  surgeries noted.  No medication allergies are noted.   SOCIAL HISTORY:  Smoking history as noted above.  He is retired.   FAMILY HISTORY:  Father died in 69 of  heart disease.   REVIEW OF SYSTEMS:  Otherwise noncontributory.   CURRENT MEDICATIONS:  Plavix 75 mg daily, aspirin 325 mg daily, Lipitor 80  mg daily, Avodart 0.5 mg daily, Aldactone 25 mg daily, Vasotec 5 mg daily,  Coreg 12.5 mg b.i.d., Niaspan 2000 mg daily.   PHYSICAL EXAMINATION:  GENERAL: Well-developed and well-nourished middle  aged male in no distress.  VITAL SIGNS:  Temperature 97.5, blood pressure 96/68, pulse 68, saturation  98% on room air.  CHEST:  Completely clear to auscultation. There is no evidence of wheeze or  rhonchi.  CARDIAC:  Regular rate and rhythm without S3. Normal S1 and S2.  ABDOMEN:  Soft, nontender. There is no  organomegaly.  EXTREMITIES:  No edema, clubbing or venous disease.  SKIN:  Clear.  NEUROLOGIC:  Intact.  HEENT:  No jugular vein distension, no lymphadenopathy, oropharynx clear,  neck supple.   IMPRESSION:  Resolved acute bronchitis. CT scan was already reviewed and  showed no active process. Hemoptysis appears to be from bronchitis. There  does not appear indication for bronchoscopy.   RECOMMENDATIONS:  Will follow the patient expectantly. There is no  indication for bronchoscopy.  The patient will return as needed.                                   Charlcie Cradle Delford Field, MD, [facility]   PEW/MedQ  DD:  01/11/2006  DT:  01/12/2006  Job #:  [id number]   cc:   Corwin Levins, MD

## 2010-09-30 NOTE — Cardiovascular Report (Signed)
Juan Mcdaniel, Juan Mcdaniel               ACCOUNT NO.:  [id number]   MEDICAL RECORD NO.:  [id number]          PATIENT TYPE:  OIB   LOCATION:  3711                         FACILITY:  [facility]   PHYSICIAN:  Salvadore Farber, MD  DATE OF BIRTH:  1936-03-30   DATE OF PROCEDURE:  05/01/2006  DATE OF DISCHARGE:                            CARDIAC CATHETERIZATION   PROCEDURE:  Left heart catheterization, left ventriculography, coronary  angiography.   INDICATIONS:  [name] is a 75 year old gentleman who suffered  anterior myocardial infarction on April 13, 2005.  He was enrolled in  the HORIZON trial comparing paclitaxel coated versus bare metal stent in  acute myocardial infarction.  He returns today for scheduled follow-up  angiogram as part of that trial.   PROCEDURE TECHNIQUE:  Informed consent was obtained.  Under 1% lidocaine  local anesthesia, a 6-French sheath was placed in the right common  femoral artery using modified Seldinger technique.  Diagnostic  angiography and ventriculography were performed using JL-4, JR-4, and  pigtail catheters.  All catheters were 6-French.  The patient tolerated  the procedure well and was transferred to the holding room in stable  condition.   COMPLICATIONS:  None.   FINDINGS:  1. LV:  89/0/6.  EF 54% with apical akinesis.  2. No aortic stenosis or mitral regurgitation.  3. Left main:  Minimal plaquing without stenosis.  4. LAD:  Moderate-sized vessel giving rise to a single small diagonal.      There are two overlapping stents in the proximal and mid vessel.      These are widely patent without significant in-stent restenosis.      There is approximately 30% stenosis just distal to the distal      margin of the stent.  The jailed marginal is small but widely      patent.  5. Circumflex:  Large codominant vessel.  There is a 40% ostial      stenosis.  The large marginal has a 30% proximal stenosis.  The      distal circumflex supplies robust  collaterals to the distal right      coronary.  6. RCA:  Codominant vessel.  It is occluded proximally.  There are      faint bridging collaterals but robust left-to-right collaterals.   IMPRESSION/PLAN:  1. Widely patent HORIZON study stents.  2. Chronic total occlusion of the right coronary artery with excellent      collateralization.  3. Improvement in left ventricular systolic function from 35-54%.  4. Normal left ventricular end diastolic pressure.      Salvadore Farber, MD  Electronically Signed    WED/MEDQ  D:  05/01/2006  T:  05/01/2006  Job:  [id number]

## 2010-09-30 NOTE — H&P (Signed)
[facility] ADMISSION   Juan Mcdaniel, Juan Mcdaniel                    MRN:          [id number]  DATE:04/17/2006                            DOB:          29-Jan-1936    Patient seen in the [facility] on April 17, 2006, as part of a  Horizon study for a 67-month followup cath.   CHIEF COMPLAINT:  He is asymptomatic, no chest pain.   HISTORY OF PRESENT ILLNESS:  This is a pleasant 75 year old divorced  white male patient, who has a history of coronary artery disease, status  post anterior wall MI in November of 2006.  He had 2 Horizon stents  placed in the LAD, and they were randomized to the drug eluting, versus  bare metal stent.  He also has a chronic occlusion, total occlusion in  the mid-RCA with collateralization from the circumflex.  EF was 35% but  improved to 50%.  He has done quite well over the past year.  He denies  any chest pain, palpitations, dyspnea, dyspnea on exertion, PND or  orthopnea.   ALLERGIES:  NO KNOWN DRUG ALLERGIES   CURRENT MEDICATIONS:  1. Plavix 75 mg daily.  2. Aspirin 325 mg daily.  3. Avodart 0.5 mg daily.  4. Aldactone 25 mg daily.  5. Xalatan eye drops for glaucoma.  6. Vasotec 5 mg daily.  7. Coreg 12.5 mg b.i.d.  8. Niaspan 1000 mg daily.  9. Zocor 20 mg q.h.s.  10.Flomax 0.4 mg daily.   PAST MEDICAL HISTORY:  Significant for hyperlipidemia, BPH and a mass  under his right nipple that he had a mammogram for, and they feel like  it is benign and does not require surgery.   SOCIAL HISTORY:  He is divorced, has no children, denies cigarette use  of abuse.  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  He usually walks a half an hour a  day.   FAMILY HISTORY:  Father died of an MI at 50.  Mother died in her 52s  after a stroke.  He has one brother that died of cirrhosis, and then  another brother that is 61 and  had a stroke.   REVIEW OF SYSTEMS:  Negative or dizziness or pre-syncopal signs or  symptoms, dyspepsia, dysphagia, nausea, vomiting, change in bowels or  melena.  CARDIOPULMONARY:  See HPI.  Patient does have a hearing loss  that he wears a hearing aid for.   PHYSICAL EXAMINATION:  GENERAL:  This is a pleasant 75 year old white  male in no acute distress.  VITAL SIGNS:  Blood pressure is 89/60, pulse 68, weight 178.  HEENT:  Head is normocephalic without any sign of trauma, extraocular  movements are intact.  Pupils are equal and reactive to light and  accommodation.  Nasal mucosa is moist.  Throat is without erythema or  exudate.  NECK:  Without JVD, HJR, bruits or thyroid enlargement.  LUNGS:  Clear anteriorly, posteriorly and lateral.  HEART:  Regular rate and rhythm at 68 beats per minute,  normal S1 and  S2, positive S4.  No murmur or rubs, bruits, thrill or heave noted.  ABDOMEN:  Soft without organomegaly, masses, lesions or abnormal  tenderness.  EXTREMITIES:  Without cyanosis, clubbing or edema.  He has good distal  pulses.   IMPRESSION:  1. A 26-month followup cath for her Horizon study.  2. Coronary artery disease status post anterior wall MI in November      2006, treated with 2 Horizon stents to the LAD, with chronic total      occlusion to the mid-RCA, with collaterals from the circumflex.      Ejection fraction was 35%, improved to 50%.  3. Hyperlipidemia, treated.  4. Benign prostatic hypertrophy.  5. Left nipple mass, felt to be benign.   PLAN:  At this time, patient is to be admitted to the [facility] lab for same  day cardiac catheterization.  He has been encouraged to have someone  drive him to and from the hospital and have someone check on him.  He is  reluctant to do this, as he has no family or neighbors close by.  The  research nurses have discussed this in detail with him and advised him  to find someone that could possibly help him for the day.       Jacolyn Reedy, PA-C  Electronically Signed      Salvadore Farber, MD  Electronically Signed   ML/MedQ  DD: 04/17/2006  DT: 04/17/2006  Job #: [id number]

## 2010-09-30 NOTE — Assessment & Plan Note (Signed)
[facility]                              CARDIOLOGY OFFICE NOTE   EMONI, YANG                        MRN:          [id number]  DATE:12/06/2005                            DOB:          [birth year]    PRIMARY CARDIOLOGIST:  Dr. Randa Evens.   PRIMARY CARE PHYSICIAN:  Dr. Thomos Lemons.   PATIENT PROFILE:  This 75 year old white male with a history of anterior MI  in 03/2005 who presents secondary to recent episodes of shortness of breath.   PROBLEM LIST:  1.  CAD.      1.  Status post anterior MI in November 2006.  Status post placement of          two Horizon study stents in the LAD.  He was also noted at that time          to have chronic total occlusion of the mid right coronary artery          with distal vessel collateralization from the circumflex.  EF was          35%.      2.  Ischemic cardiomyopathy, most recent EF 50% by echocardiogram,          June 13, 2005.  2.  BPH.  3.  Glaucoma.  4.  Hyperlipidemia.   HISTORY OF PRESENT ILLNESS:  This is a 75 year old white male with prior  history of CAD, status post anterior MI in November 2006 as outlined above,  who last saw Dr. Samule Ohm in March 2007.  He reports that approximately 6-8  weeks ago he noticed some increasing shortness of breath with productive  cough and mucus.  He saw his primary care physician on November 30, 2005, and  was initiated on antibiotic therapy and also scheduled for PFTs and a chest  CT which occurred yesterday.  He has noted that mostly his shortness of  breath occurs in the evening when laying down and becomes associated with  some cough and sinus congestion.  Despite this, he is able to walk daily, 35-  45 minutes without any shortness of breath.  He says that he does have a  strange feeling in the center of his chest for the first 30 seconds or so of  his walk, but that this resolves almost immediately and does not prevent him  from walking.  He has no  symptoms during his walk.  He also reports today a  nagging cough that has been present for 6 or 8 weeks.  It is nonproductive  in nature and occurs throughout the day.  He currently is on Enalapril but  when I discussed taking an alternate form which would not be generic, he  says he is not coughing that much.  He would rather have the generic  medicine.  He denies any PND, orthopnea, dizziness, syncope, edema, or early  satiety.   CURRENT MEDICATIONS:  Plavix 75 mg q.d.  Aspirin325 mg q.d.  Lipitor 80 mg q.d.  Avodart 0.5 mg q.d.  Aldactone 25 mg q.d.  Xalatan eye drops q.d.  Vasotec 5 mg q.d.  Coreg 12.5 mg b.i.d.  Niaspan 2000 mg q.d.  He is currently on an antibiotic which he does not know the name of.   PHYSICAL EXAM:  Blood pressure 98/68.  Heart rate 75.  Respirations 16.  He  is afebrile.  His weight is 176 pounds.  Pleasant white male in no acute distress. Awake, alert, and oriented x3.  NECK:  Normal carotid upstrokes.  No bruises or JVD.  LUNGS:  Respirations are regular and unlabored.  Clear to auscultation.  CARDIAC:  Regular S1, S2.  No S3, S4, or murmur.  ABDOMEN:  Round, soft, nontender, nondistended.  Bowel sounds present.  EXTREMITIES:  Warm, dry, pink.  No clubbing, cyanosis, or edema.  Dorsalis  pedis and posterior tibial pulses 1+ and equal bilaterally.  ACCESSORY CLINICAL FINDINGS:  Chest CT from December 05, 2005, showed no acute  finding in the chest with some mild dependent atelectasis noted.  He had  PFTs which looked fairly normal with a FVC of  3.9 (82%), FEV1 of 2.94  (91%), FEV1/FVC% is 87%, DLCO of 25.4 which is 119%.   ASSESSMENT AND PLAN:  1.  Shortness of breath.  The patient reports that this is mostly associated      with a productive cough and increased congestion and mucus production.      He is currently on antibiotic therapy.  He has not had any significant      recurrent angina, although he has had a small amount of discomfort, he      says,  at the beginning of his walk, lasting less than a minute and not      preventing him from continuing to walk.  He does not experience any      dyspnea on exertion during his walk.  It seems unlikely that this would      be cardiac in nature and I offered him a functional study to rule out      ischemia, however at this point he would like to just continue on      antibiotic therapy and see how he feels afterwards.  He says that if he      has any worsening shortness of breath or discomfort in his chest, that      he will notify us so that we can go ahead and schedule a stress test.  2.  Coronary artery disease.  The patient sounds to be doing well from this      standpoint with the exception of shortness of breath as listed above.      Will continue medical therapy at this point as he does not want to      undergo any functional testing and I advise him that that would be okay      at this point.  He remains on aspirin, Plavix, statin, beta-blocker, and      Ace inhibitor therapy.  3.  Ischemic cardiomyopathy, last documented EF was 50%.  He is euvolemic.      He remains on beta-blocker, Ace, and spironolactone.  His blood pressure      is on the low end and thus prohibits [place] from titrating his beta-blocker      or Ace.  4.  Benign prostatic hypertrophy.  No complaints.  He remains on Avodart.  5.  Hyperlipidemia.  He is being treated with Lipitor therapy which he is      tolerating well.  Last lipid check was in  May 2007 with an LDL of 42,      total cholesterol of 76.  His LFTs were normal at that time.  Will make      no changes.   DISPOSITION:  Follow up with Dr. Samule Ohm in 3-6 months.                                Nicolasa Ducking, ANP    CB/MedQ  DD:  12/06/2005  DT:  12/06/2005  Job #:  [id number]

## 2010-09-30 NOTE — Assessment & Plan Note (Signed)
[facility]                              CARDIOLOGY OFFICE NOTE   Juan Mcdaniel, Juan Mcdaniel                    MRN:          [id number]  DATE:01/18/2006                            DOB:          1936/01/26    ADDENDUM:  I neglected to mention in the note that the patient has  approximately a 2 x 2-cm mass underneath his right nipple.  A similar mass  does not feel present under his left nipple.  It is not tender and there is  no fluid expressed from the nipple.  The patient requests referral to a  general surgeon.  I have left a message with [facility],  requesting an appointment.  They will contact the patient.                                 Salvadore Farber, MD    WED/MedQ  DD:  01/18/2006  DT:  01/19/2006  Job #:  [id number]

## 2010-09-30 NOTE — Assessment & Plan Note (Signed)
[facility]                            CARDIOLOGY OFFICE NOTE   BELMONT, VALLI                    MRN:          [id number]  DATE:07/16/2006                            DOB:          1935-06-11    PRIMARY CARE PHYSICIAN:  Barbette Hair. Artist Pais, DO   HISTORY OF PRESENT ILLNESS:  Juan Mcdaniel is a 75 year old gentleman who  suffered anterior myocardial infarction in November of 2006.  I placed  two overlapping Horizon Study stents in the LAD.  At that time he was  noted to have chronic total occlusion of the right coronary artery with  distal collateralization from the circumflex.  We have managed this  medically.  Horizon Study follow-up catheterization in November of 2007  demonstrated the LAD stents to be patent and no change in his other  coronary anatomy.  EF had improved from 35% to 54%.   Juan Mcdaniel continues to do nicely.  He has had no chest discomfort, PND,  orthopnea, edema, palpitations, syncope, or claudication.  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   CURRENT MEDICATIONS:  1. Plavix 75 mg daily.  2. Aspirin 325 mg daily.  3. Aldactone 25 mg daily.  4. Xalatan eye drops.  5. Vasotec 5 mg daily.  6. Coreg 12.5 mg daily.  7. Flomax 0.4 mg daily.  8. Niaspan 1000 mg q.h.s.  9. Zocor 20 mg nightly.  10.Rhinocort nasal spray.   PHYSICAL EXAMINATION:  GENERAL:  He is generally well-appearing in no  distress.  VITAL SIGNS:  Heart rate 72, blood pressure 98/62, and weight 181  pounds.  NECK:  He has no jugular venous distention, thyromegaly, or  lymphadenopathy.  LUNGS:  Clear to auscultation.  HEART:  He has a nondisplaced point of maximal cardiac impulse.  There  is a regular rate and rhythm without murmur, rub, or gallop.  ABDOMEN:  Soft, nondistended, and nontender.  There is no  hepatosplenomegaly.  Bowel sounds are normal.  EXTREMITIES:  Warm without cyanosis, clubbing, or edema, or ulcerations.  Carotid pulses are 2+  bilaterally without bruit.   Electrocardiogram demonstrates normal sinus rhythm with prior anterior  myocardial infarction.  No change compared with prior.   IMPRESSION/RECOMMENDATIONS:  1. Coronary artery disease, status post anterior myocardial      infarction, ejection fraction now 54%.  Continue current management      including aspirin and Plavix indefinitely.  We will drop      the aspirin dose to 81 mg daily.  Continue ACE inhibitor, beta      blocker, and spironolactone.  2. Hypercholesterolemia.  Continue Zocor plus Niaspan.     Salvadore Farber, MD  Electronically Signed    WED/MedQ  DD: 07/16/2006  DT: 07/16/2006  Job #: [id number]   cc:   Barbette Hair. Artist Pais, DO

## 2010-09-30 NOTE — Discharge Summary (Signed)
NAMEARIAS, WEINERT               ACCOUNT NO.:  [id number]   MEDICAL RECORD NO.:  [id number]          PATIENT TYPE:  OIB   LOCATION:  3711                         FACILITY:  [facility]   PHYSICIAN:  Salvadore Farber, MD  DATE OF BIRTH:  [birth year]   DATE OF ADMISSION:  05/01/2006  DATE OF DISCHARGE:  05/02/2006                               DISCHARGE SUMMARY   PRIMARY CARDIOLOGIST:  Dr. Randa Evens.   PRIMARY CARE PHYSICIAN:  Dr. Thomos Lemons.   PRINCIPAL DIAGNOSIS:  Coronary artery disease.   SECONDARY DIAGNOSES:  1. Hyperlipidemia.  2. History of ischemia cardiomyopathy.  3. Benign prostatic hypertrophy.  4. Right nipple mass, benign.  5. Glaucoma.   ALLERGIES:  NO KNOWN DRUG ALLERGIES.   PROCEDURES:  Left heart catheterization.   HISTORY OF PRESENT ILLNESS:  [age]-year-old, divorced, white male  with prior history of coronary artery disease, status post anterior wall  MI, November of 2006 with subsequent stenting of the LAD with 2 Horizon  study stents.  He has been followed by the [employer] in the  [facility] and as part of study protocol,  arrangements were made for left heart cardiac catheterization to take  place on December 18.   HOSPITAL COURSE:  [name] underwent left heart cardiac  catheterization on December 18, revealing widely patent Horizon study  stents x2.  He continues to have a chronic total occlusion of the mid  right coronary artery with faint right to left bridging collaterals, as  well as left to right collaterals.  His EF was noted to be improved at  54%, up from 35%.  He tolerated the catheterization well.  He has been  ambulating this morning without any discomfort or groin complaints.  He  is being discharged home today in satisfactory condition.   DISCHARGE LABS:  Hemoglobin 15.1, hematocrit 43.4, WBC 6.6, platelets  148.  Sodium 141, potassium 4.1, chloride 109, CO2 25, BUN 13,  creatinine 0.9,  glucose 103, calcium 9.3.   DISPOSITION:  Patient is being discharged home today in good condition.   FOLLOWUP PLANS AND APPOINTMENT:  He has a followup appointment with Dr.  Barbette Merino nurse practitioner or PA on May 22, 2006 at 9:15  a.m.  He is asked to follow up with Dr. Artist Pais as previously scheduled.   OUTSTANDING LAB STUDIES:  None.   DISCHARGE MEDICATIONS:  1. Aspirin 81 mg daily.  2. Plavix 75 mg daily.  3. Niaspan 2000 mg q.h.s.  4. Spironolactone 25 mg daily.  5. Enalapril 5 mg daily.  6. Flomax 0.4 mg daily.  7. Simvastatin 20 mg daily.  8. Coreg 12.5 mg b.i.d.  9. Xalatan eye drops left eye as prescribed.  10.Nitroglycerin 0.4 mg sublingual p.r.n. chest pain.   DURATION DISCHARGE ENCOUNTER:  Forty-five minutes, including physician  time.      Nicolasa Ducking, ANP      Salvadore Farber, MD  Electronically Signed    CB/MEDQ  D:  05/02/2006  T:  05/02/2006  Job:  [id number]   cc:   Barbette Hair. Artist Pais,  DO

## 2010-09-30 NOTE — Assessment & Plan Note (Signed)
[name]                              CARDIOLOGY OFFICE NOTE   [name], [name]                    MRN:          [id number]  DATE:01/18/2006                            DOB:          1936/05/13    PRIMARY CARE PHYSICIAN:  Dr. Thomos Lemons.   HISTORY OF PRESENT ILLNESS:  [name] is a 75 year old gentleman who  suffered anterior myocardial infarction in November 2006.  I placed 2  Horizon Study stents in the LAD (these are randomized drug-eluting versus  bare-metal).  He also has a chronic total occlusion of the mid right  coronary with collateralization from the circumflex.  His ejection fraction  was 35%.  Ejection fraction has subsequently improved to 50%.   His post infarct course has been uncomplicated.  He was recently seen here  for some dyspnea in the setting of a productive cough and low-grade fever.  He was treated by Dr. Artist Pais with a short course of antibiotics.  His symptoms  entirely resolved with the antibiotics.  However, they did subsequently  recur.  Dr. Artist Pais is currently treating this again.   [name] is not having any chest discomfort, PND, orthopnea or edema.  When he is taking the antibiotics, he has no cough or dyspnea.  He is  concerned about the cost of his medications, and asks that we work to  minimize his cost if at all possible.   CURRENT MEDICATIONS:  1. Plavix 75 mg per day.  2. Aspirin 325 mg per day.  3. Lipitor 80 mg per day.  4. Avodart 0.5 mg per day.  5. Aldactone 25 mg per day.  6. Xalatan eye drops.  7. Vasotec 5 mg per day.  8. Carvedilol 12.5 mg twice per day.  9. Niaspan 2 grams per day.   PHYSICAL EXAMINATION:  GENERAL:  He is generally well appearing, in no  distress.  VITAL SIGNS:  Heart rate is 73, blood pressure 100/62, and weight of 175  pounds.  NECK:  He has no jugular venous distention and no thyromegaly.  LUNGS:  Clear to auscultation.  CARDIAC:  He has a nondisplaced point of  maximal cardiac impulse.  There is  a regular rate and rhythm without murmur, rub or gallop.  ABDOMEN:  Soft, nondistended and nontender.  There is no hepatosplenomegaly.  Bowel sounds are normal.  EXTREMITIES:  Warm without clubbing, cyanosis, edema or ulceration.  Carotid  pulses are 2+ bilaterally without bruit.   Electrocardiogram today demonstrates normal sinus rhythm with anteroseptal  myocardial infarction.   Laboratory studies performed January 01, 2006, remarkable for creatinine 1.0,  sodium 140, potassium 4.1, hemoglobin A1C 5.2, LDL 43, HDL 35.   IMPRESSION/RECOMMENDATIONS:  1. Coronary disease:  Doing nicely after anterior myocardial infarction.      Ejection fraction is now 50%.  He needs to continue on his Vasotec,      carvedilol and aldactone at the present doses.  He needs to continue      the aspirin and the Plavix indefinitely, given the LAD stents.  I      advised  him that the Coreg has recently become available generically,      and we expect the price to drop in the next 6 months or so.  2. Hypercholesterolemia, with elevated LDL and low HDL.  His LDL is      clearly excellent on the high-dose Lipitor.  Given his difficult      financial status and the cost of the Lipitor, we will stop the Lipitor      in favor of Zocor 20 mg per day.  We will continue the Niaspan, but      consider dropping the dose on this after recheck of lipids in 8 weeks.  3. Benign prostatic hypertrophy:  Concerned with symptoms, wishes to take      Flomax but concerned about blood pressure.  I got him to try it just      before going to bed.   I will plan on seeing him back in a year.   ADDENDUM:  I neglected to mention in the note that the patient has  approximately a 2 x 2-cm mass underneath his right nipple.  A similar mass  does not feel present under his left nipple.  It is not tender and there is  no fluid expressed from the nipple.  The patient requests referral to a  general  surgeon.  I have left a message with [facility],  requesting an appointment.  They will contact the patient.                                  Salvadore Farber, MD   Job #:  [id number]   WED/MedQ  DD:  01/18/2006  DT:  01/19/2006  Job #:  [id number]   cc:   Barbette Hair. Artist Pais, DO

## 2010-10-03 ENCOUNTER — Other Ambulatory Visit: Payer: Self-pay | Admitting: Cardiology

## 2010-10-07 NOTE — Telephone Encounter (Signed)
plavix 75 mg

## 2010-10-10 ENCOUNTER — Other Ambulatory Visit: Payer: Self-pay | Admitting: *Deleted

## 2010-10-10 MED ORDER — CLOPIDOGREL BISULFATE 75 MG PO TABS: 75.0000 mg | ORAL_TABLET | Freq: Every day | ORAL | Status: DC

## 2010-10-20 ENCOUNTER — Ambulatory Visit: Payer: Self-pay | Admitting: Internal Medicine

## 2010-10-21 ENCOUNTER — Ambulatory Visit (INDEPENDENT_AMBULATORY_CARE_PROVIDER_SITE_OTHER): Payer: Medicare Other | Admitting: Internal Medicine

## 2010-10-21 ENCOUNTER — Other Ambulatory Visit (INDEPENDENT_AMBULATORY_CARE_PROVIDER_SITE_OTHER): Payer: Medicare Other

## 2010-10-21 ENCOUNTER — Encounter: Payer: Self-pay | Admitting: Internal Medicine

## 2010-10-21 VITALS — BP 92/60 | HR 69 | Temp 98.5°F | Ht 70.0 in | Wt 190.0 lb

## 2010-10-21 DIAGNOSIS — R5381 Other malaise: Secondary | ICD-10-CM

## 2010-10-21 DIAGNOSIS — Z0000 Encounter for general adult medical examination without abnormal findings: Secondary | ICD-10-CM | POA: Insufficient documentation

## 2010-10-21 DIAGNOSIS — R7302 Impaired glucose tolerance (oral): Secondary | ICD-10-CM

## 2010-10-21 DIAGNOSIS — E785 Hyperlipidemia, unspecified: Secondary | ICD-10-CM

## 2010-10-21 DIAGNOSIS — R5383 Other fatigue: Secondary | ICD-10-CM

## 2010-10-21 DIAGNOSIS — M722 Plantar fascial fibromatosis: Secondary | ICD-10-CM | POA: Insufficient documentation

## 2010-10-21 DIAGNOSIS — R7309 Other abnormal glucose: Secondary | ICD-10-CM

## 2010-10-21 DIAGNOSIS — Z23 Encounter for immunization: Secondary | ICD-10-CM

## 2010-10-21 LAB — HEPATIC FUNCTION PANEL
ALT: 18 U/L (ref 0–53)
AST: 24 U/L (ref 0–37)
Albumin: 4.3 g/dL (ref 3.5–5.2)
Alkaline Phosphatase: 70 U/L (ref 39–117)
Bilirubin, Direct: 0.2 mg/dL (ref 0.0–0.3)
Total Bilirubin: 1 mg/dL (ref 0.3–1.2)
Total Protein: 7.7 g/dL (ref 6.0–8.3)

## 2010-10-21 LAB — BASIC METABOLIC PANEL
BUN: 11 mg/dL (ref 6–23)
CO2: 27 mEq/L (ref 19–32)
Calcium: 8.8 mg/dL (ref 8.4–10.5)
Chloride: 109 mEq/L (ref 96–112)
Creatinine, Ser: 0.8 mg/dL (ref 0.4–1.5)
GFR: 106.96 mL/min (ref >60.00)
Glucose, Bld: 92 mg/dL (ref 70–99)
Potassium: 4.1 mEq/L (ref 3.5–5.1)
Sodium: 140 mEq/L (ref 135–145)

## 2010-10-21 LAB — LIPID PANEL
Cholesterol: 106 mg/dL (ref 0–200)
LDL Cholesterol: 50 mg/dL (ref 0–99)
Total CHOL/HDL Ratio: 2
Triglycerides: 53 mg/dL (ref 0.0–149.0)
VLDL: 10.6 mg/dL (ref 0.0–40.0)

## 2010-10-21 LAB — HEMOGLOBIN A1C: Hgb A1c MFr Bld: 5.3 % (ref 4.6–6.5)

## 2010-10-21 LAB — CBC WITH DIFFERENTIAL/PLATELET
Basophils Absolute: 0 10*3/uL (ref 0.0–0.1)
Basophils Relative: 0.3 % (ref 0.0–3.0)
Eosinophils Absolute: 0.1 10*3/uL (ref 0.0–0.7)
Eosinophils Relative: 2.4 % (ref 0.0–5.0)
HCT: 42.1 % (ref 39.0–52.0)
Hemoglobin: 14.5 g/dL (ref 13.0–17.0)
Lymphocytes Relative: 38.8 % (ref 12.0–46.0)
Lymphs Abs: 2.4 10*3/uL (ref 0.7–4.0)
MCHC: 34.5 g/dL (ref 30.0–36.0)
MCV: 94.4 fl (ref 78.0–100.0)
Monocytes Absolute: 0.5 10*3/uL (ref 0.1–1.0)
Monocytes Relative: 8.1 % (ref 3.0–12.0)
Neutro Abs: 3 10*3/uL (ref 1.4–7.7)
Neutrophils Relative %: 50.4 % (ref 43.0–77.0)
Platelets: 148 10*3/uL — ABNORMAL LOW (ref 150.0–400.0)
RBC: 4.46 Mil/uL (ref 4.22–5.81)
RDW: 13.7 % (ref 11.5–14.6)
WBC: 6.1 10*3/uL (ref 4.5–10.5)

## 2010-10-21 LAB — TSH: TSH: 0.99 u[IU]/mL (ref 0.35–5.50)

## 2010-10-21 MED ORDER — PNEUMOCOCCAL VAC POLYVALENT 25 MCG/0.5ML IJ INJ
0.5000 mL | INJECTION | Freq: Once | INTRAMUSCULAR | Status: AC
  Administered 2010-10-21: 0.5 mL via INTRAMUSCULAR

## 2010-10-21 NOTE — Progress Notes (Signed)
Subjective:    Patient ID: Juan Mcdaniel, male    DOB: 1935/11/24, 75 y.o.   MRN: [id number]  HPI  Here to f/u - overall doing ok;  Here to f/u; overall doing ok,  Pt denies chest pain, increased sob or doe, wheezing, orthopnea, PND, increased LE swelling, palpitations, dizziness or syncope.  Pt denies new neurological symptoms such as new headache, or facial or extremity weakness or numbness   Pt denies polydipsia, polyuria.  He is concerned about sugar but never had signficant glucose in the past  Pt states overall good compliance with meds, trying to follow lower cholesterol diet, wt overall stable but little exercise however. Still working part time.  Hearing somewhat worse this yr. Does have sense of ongoing fatigue, but denies signficant hypersomnolence. Does have signficant 2-3 mo grad increased left plantar heel pain with first few steps in the AM, or after sitting for over 30 min,  Despite better walking shoes ; tries to walk 30 min per day for excercise  Past Medical History  Diagnosis Date  . ALLERGIC RHINITIS 07/23/2007  . ANXIETY 07/23/2007  . BENIGN PROSTATIC HYPERTROPHY, HX OF 06/14/2007  . BENIGN PROSTATIC HYPERTROPHY 07/23/2007  . CARDIOMYOPATHY 02/10/2009  . CARPAL TUNNEL SYNDROME, LEFT 07/16/2009  . CORONARY ARTERY DISEASE 06/14/2007  . DEGENERATIVE JOINT DISEASE, KNEES, BILATERAL 10/03/2007  . DEPRESSION 06/14/2007  . FATIGUE 02/15/2010  . GLAUCOMA 06/14/2007  . GLUCOSE INTOLERANCE 02/15/2010  . HYPERCHOLESTEROLEMIA 02/10/2009  . HYPERLIPIDEMIA 06/14/2007  . HYPERTENSION 06/14/2007  . MYOCARDIAL INFARCTION, HX OF 07/23/2007  . OSTEOARTHRITIS 02/10/2009  . SINUSITIS- ACUTE-NOS 07/23/2007  . URI 10/20/2008   Past Surgical History  Procedure Date  . Turp vaporization 05/15/85    reports that he has quit smoking. He does not have any smokeless tobacco history on file. He reports that he drinks alcohol. He reports that he does not use illicit drugs. family history is not on file. No  Known Allergies Current Outpatient Prescriptions on File Prior to Visit  Medication Sig Dispense Refill  . aspirin 81 MG tablet Take 81 mg by mouth daily.        . carvedilol (COREG) 25 MG tablet Take 1/2 tablet every morning and 1/2 tablet every evening       . clopidogrel (PLAVIX) 75 MG tablet Take 1 tablet (75 mg total) by mouth daily.  30 tablet  3  . diclofenac sodium (VOLTAREN) 1 % GEL Apply 2 grams four times a day       . DIOVAN 40 MG tablet TAKE ONE TABLET BY MOUTH EVERY DAY  30 each  6  . doxazosin (CARDURA) 8 MG tablet Take 1/2 tablet by mouth once a day       . niacin (NIASPAN) 1000 MG CR tablet Take 1,000 mg by mouth 2 (two) times daily.        . nitroGLYCERIN (NITROSTAT) 0.4 MG SL tablet Place 0.4 mg under the tongue every 5 (five) minutes as needed.        . simvastatin (ZOCOR) 20 MG tablet TAKE ONE TABLET BY MOUTH EVERY NIGHT  30 tablet  6  . travoprost, benzalkonium, (TRAVATAN) 0.004 % ophthalmic solution 1 drop at bedtime.        Alphonsus Sias Pollen 500 MG TABS Take by mouth 2 (two) times daily.        . Misc Natural Products (OSTEO BI-FLEX ADV JOINT SHIELD) TABS Take by mouth 2 (two) times daily.  No current facility-administered medications on file prior to visit.   Review of Systems Review of Systems  Constitutional: Negative for diaphoresis and unexpected weight change.  HENT: Negative for drooling and tinnitus.   Eyes: Negative for photophobia and visual disturbance.  Respiratory: Negative for choking and stridor.   Gastrointestinal: Negative for vomiting and blood in stool.  Genitourinary: Negative for hematuria and decreased urine volume.  Musculoskeletal: Negative for gait problem.  Skin: Negative for color change and wound.  Neurological: Negative for tremors and numbness.  Psychiatric/Behavioral: Negative for decreased concentration. The patient is not hyperactive.       Objective:   Physical Exam BP 92/60  Pulse 69  Temp(Src) 98.5 F (36.9 C) (Oral)   Ht 5\' 10"  (1.778 m)  Wt 190 lb (86.183 kg)  BMI 27.26 kg/m2  SpO2 96% Physical Exam  VS noted Constitutional: Pt is oriented to person, place, and time. Appears well-developed and well-nourished.  HENT:  Head: Normocephalic and atraumatic.  Right Ear: External ear normal.  Left Ear: External ear normal.  Nose: Nose normal.  Mouth/Throat: Oropharynx is clear and moist.  Eyes: Conjunctivae and EOM are normal. Pupils are equal, round, and reactive to light.  Neck: Normal range of motion. Neck supple. No JVD present. No tracheal deviation present.  Cardiovascular: Normal rate, regular rhythm, normal heart sounds and intact distal pulses.   Pulmonary/Chest: Effort normal and breath sounds normal.  Abdominal: Soft. Bowel sounds are normal. There is no tenderness.  Musculoskeletal: Normal range of motion. Exhibits no edema.  Lymphadenopathy:  Has no cervical adenopathy.  Neurological: Pt is alert and oriented to person, place, and time. Pt has normal reflexes. No cranial nerve deficit.  Skin: Skin is warm and dry. No rash noted. Several bruise to arms noted on the asa/plavix Psychiatric:  Has  normal mood and affect. Behavior is normal.         Assessment & Plan:

## 2010-10-21 NOTE — Progress Notes (Signed)
Quick Note:  Voice message left on PhoneTree system - lab is negative, normal or otherwise stable, pt to continue same tx ______ 

## 2010-10-21 NOTE — Assessment & Plan Note (Signed)
stable overall by hx and exam, most recent data reviewed with pt, and pt to continue medical treatment as before  Lab Results  Component Value Date   LDLCALC 55 02/15/2010

## 2010-10-21 NOTE — Assessment & Plan Note (Signed)
asympt - for a1c check today, cont diet and wt control efforts;   to f/u any worsening symptoms or concerns

## 2010-10-21 NOTE — Patient Instructions (Signed)
You had the pneumonia shot today Please go to LAB in the Basement for the blood and/or urine tests to be done today Please call the phone number [phone] (the [employer]Tree System) for results of testing in 2-3 days;  When calling, simply dial the number, and when prompted enter the MRN number above (the Medical Record Number) and the # key, then the message should start. You can use tylenol prn for the left heel pain;  Please call if you need referral to podiatry for cortisone shot Please keep your appointments with your specialists as you have planned - Dr Antoine Poche in October 2012 Please check with your Gastroenterologist about when you should return for the next colonoscopy Please return in 1 year for your yearly visit, or sooner if needed

## 2010-10-21 NOTE — Assessment & Plan Note (Signed)
Etiology unclear, Exam otherwise benign, to check labs as documented, follow with expectant management  

## 2010-10-21 NOTE — Assessment & Plan Note (Signed)
Exam benign, has mobic prn, and can also use tylenol prn,  For shoe inserts, soft soled shoes, avoid walkng uphill, AM strectching excercises, and consider podiatry for cortisone injection (declines today)

## 2010-10-21 NOTE — Assessment & Plan Note (Signed)
Not charged, but de for pneumonia shot today

## 2010-11-06 ENCOUNTER — Other Ambulatory Visit: Payer: Self-pay | Admitting: *Deleted

## 2010-11-06 MED ORDER — NIACIN ER (ANTIHYPERLIPIDEMIC) 1000 MG PO TBCR: 1000.0000 mg | EXTENDED_RELEASE_TABLET | Freq: Two times a day (BID) | ORAL | Status: DC

## 2011-03-04 ENCOUNTER — Other Ambulatory Visit: Payer: Self-pay | Admitting: Internal Medicine

## 2011-03-04 ENCOUNTER — Other Ambulatory Visit: Payer: Self-pay | Admitting: Cardiology

## 2011-03-16 ENCOUNTER — Ambulatory Visit (INDEPENDENT_AMBULATORY_CARE_PROVIDER_SITE_OTHER): Payer: Medicare Other | Admitting: Cardiology

## 2011-03-16 ENCOUNTER — Encounter: Payer: Self-pay | Admitting: Cardiology

## 2011-03-16 DIAGNOSIS — I10 Essential (primary) hypertension: Secondary | ICD-10-CM

## 2011-03-16 DIAGNOSIS — I2510 Atherosclerotic heart disease of native coronary artery without angina pectoris: Secondary | ICD-10-CM

## 2011-03-16 DIAGNOSIS — E785 Hyperlipidemia, unspecified: Secondary | ICD-10-CM

## 2011-03-16 DIAGNOSIS — I428 Other cardiomyopathies: Secondary | ICD-10-CM

## 2011-03-16 DIAGNOSIS — E7800 Pure hypercholesterolemia, unspecified: Secondary | ICD-10-CM

## 2011-03-16 MED ORDER — DOXAZOSIN MESYLATE 8 MG PO TABS: 8.0000 mg | ORAL_TABLET | Freq: Every day | ORAL | Status: DC

## 2011-03-16 MED ORDER — VALSARTAN 40 MG PO TABS: 40.0000 mg | ORAL_TABLET | Freq: Every day | ORAL | Status: DC

## 2011-03-16 MED ORDER — NITROGLYCERIN 0.4 MG SL SUBL: 0.4000 mg | SUBLINGUAL_TABLET | SUBLINGUAL | Status: DC | PRN

## 2011-03-16 MED ORDER — SIMVASTATIN 20 MG PO TABS: 20.0000 mg | ORAL_TABLET | Freq: Every day | ORAL | Status: DC

## 2011-03-16 MED ORDER — CARVEDILOL 25 MG PO TABS: 25.0000 mg | ORAL_TABLET | Freq: Two times a day (BID) | ORAL | Status: DC

## 2011-03-16 MED ORDER — CLOPIDOGREL BISULFATE 75 MG PO TABS: 75.0000 mg | ORAL_TABLET | Freq: Every day | ORAL | Status: DC

## 2011-03-16 NOTE — Assessment & Plan Note (Signed)
I will bring the patient back for a POET (Plain Old Exercise Test). This will allow me to screen for obstructive coronary disease, risk stratify and very importantly provide a prescription for exercise.  I want to get him back into an exercise regime.

## 2011-03-16 NOTE — Assessment & Plan Note (Signed)
His last EF was in the 50s.  I would not suspect any new heart failure.  No change in therapy or further imaging is needed at this point.

## 2011-03-16 NOTE — Assessment & Plan Note (Signed)
Lab Results  Component Value Date   CHOL 106 10/21/2010   HDL 45.40 10/21/2010   LDLCALC 50 10/21/2010   TRIG 53.0 10/21/2010   CHOLHDL 2 10/21/2010   He will remain on the meds as listed.

## 2011-03-16 NOTE — Assessment & Plan Note (Signed)
The blood pressure is at target. No change in medications is indicated. We will continue with therapeutic lifestyle changes (TLC).  

## 2011-03-16 NOTE — Progress Notes (Signed)
HPI The patient presents for follow up of CAD.  Since I last saw him he has done well. The patient denies any new symptoms such as chest discomfort, neck or arm discomfort. There has been no new shortness of breath, PND or orthopnea. There have been no reported palpitations, presyncope or syncope.  He had to stop walking as much as he was because of tendinitis. He does work with light weights and does "sit ups while I am driving."    No Known Allergies  Current Outpatient Prescriptions  Medication Sig Dispense Refill  . aspirin 81 MG tablet Take 81 mg by mouth daily.        . carvedilol (COREG) 25 MG tablet TAKE ONE-HALF TABLET BY MOUTH IN THE MORNING AND  ONE-HALF TABLET  AT BEDTIME  30 tablet  6  . clopidogrel (PLAVIX) 75 MG tablet Take 1 tablet (75 mg total) by mouth daily.  30 tablet  3  . DIOVAN 40 MG tablet TAKE ONE TABLET BY MOUTH EVERY DAY  30 each  6  . doxazosin (CARDURA) 8 MG tablet Take 1/2 tablet by mouth once a day       . Multiple Vitamins-Minerals (ONE-A-DAY MENS VITACRAVES PO) Take by mouth daily.        Marland Kitchen NIASPAN 1000 MG CR tablet TAKE ONE TABLET BY MOUTH TWICE DAILY  60 each  6  . nitroGLYCERIN (NITROSTAT) 0.4 MG SL tablet Place 0.4 mg under the tongue every 5 (five) minutes as needed.        . simvastatin (ZOCOR) 20 MG tablet TAKE ONE TABLET BY MOUTH EVERY NIGHT  30 tablet  6  . travoprost, benzalkonium, (TRAVATAN) 0.004 % ophthalmic solution 1 drop at bedtime.        . VOLTAREN 1 % GEL APPLY 2 GRAMS EXTERNALLY 4 TIMES DAILY  500 g  2  . Bee Pollen 500 MG TABS Take by mouth 2 (two) times daily.        . Misc Natural Products (OSTEO BI-FLEX ADV JOINT SHIELD) TABS Take by mouth 2 (two) times daily.          Past Medical History  Diagnosis Date  . ALLERGIC RHINITIS 07/23/2007  . ANXIETY 07/23/2007  . BENIGN PROSTATIC HYPERTROPHY, HX OF 06/14/2007  . BENIGN PROSTATIC HYPERTROPHY 07/23/2007  . CARDIOMYOPATHY 02/10/2009  . CARPAL TUNNEL SYNDROME, LEFT 07/16/2009  . CORONARY  ARTERY DISEASE 06/14/2007  . DEGENERATIVE JOINT DISEASE, KNEES, BILATERAL 10/03/2007  . DEPRESSION 06/14/2007  . FATIGUE 02/15/2010  . GLAUCOMA 06/14/2007  . GLUCOSE INTOLERANCE 02/15/2010  . HYPERCHOLESTEROLEMIA 02/10/2009  . HYPERLIPIDEMIA 06/14/2007  . HYPERTENSION 06/14/2007  . MYOCARDIAL INFARCTION, HX OF 07/23/2007  . OSTEOARTHRITIS 02/10/2009  . SINUSITIS- ACUTE-NOS 07/23/2007  . URI 10/20/2008    Past Surgical History  Procedure Date  . Turp vaporization 05/15/85    ROS:  As stated in the HPI and negative for all other systems.  PHYSICAL EXAM BP 110/66  Pulse 58  Resp 18  Ht 5\' 10"  (1.778 m)  Wt 189 lb 6.4 oz (85.911 kg)  BMI 27.18 kg/m2 GENERAL:  Well appearing HEENT:  Pupils equal round and reactive, fundi not visualized, oral mucosa unremarkable NECK:  No jugular venous distention, waveform within normal limits, carotid upstroke brisk and symmetric, no bruits, no thyromegaly LYMPHATICS:  No cervical, inguinal adenopathy LUNGS:  Clear to auscultation bilaterally BACK:  No CVA tenderness CHEST:  Unremarkable HEART:  PMI not displaced or sustained,S1 and S2 within normal limits, no S3, no  S4, no clicks, no rubs, no murmurs ABD:  Flat, positive bowel sounds normal in frequency in pitch, no bruits, no rebound, no guarding, no midline pulsatile mass, no hepatomegaly, no splenomegaly EXT:  2 plus pulses throughout, no edema, no cyanosis no clubbing SKIN:  No rashes no nodules NEURO:  Cranial nerves II through XII grossly intact, motor grossly intact throughout PSYCH:  Cognitively intact, oriented to person place and time   EKG:  Sinus rhythm with sinus arrhythmia, old anteroseptal myocardial infarction, no acute ST-T wave changes.  ASSESSMENT AND PLAN

## 2011-03-16 NOTE — Patient Instructions (Signed)
Please stop your Niaspan  Continue all other medications as listed  Please take over the counter Niacin (B-3) 2,000 mg a day.  Your physician has requested that you have an exercise tolerance test. For further information please visit [email or web address] Please also follow instruction sheet, as given.

## 2011-03-30 ENCOUNTER — Ambulatory Visit (INDEPENDENT_AMBULATORY_CARE_PROVIDER_SITE_OTHER): Payer: Medicare Other | Admitting: Cardiology

## 2011-03-30 DIAGNOSIS — I2510 Atherosclerotic heart disease of native coronary artery without angina pectoris: Secondary | ICD-10-CM

## 2011-03-30 DIAGNOSIS — I10 Essential (primary) hypertension: Secondary | ICD-10-CM

## 2011-03-30 DIAGNOSIS — I255 Ischemic cardiomyopathy: Secondary | ICD-10-CM

## 2011-03-30 NOTE — Patient Instructions (Signed)
Your physician has requested that you have an echocardiogram. Echocardiography is a painless test that uses sound waves to create images of your heart. It provides your doctor with information about the size and shape of your heart and how well your heart's chambers and valves are working. This procedure takes approximately one hour. There are no restrictions for this procedure.  Your physician has requested that you have a lexiscan myoview. For further information please visit [email or web address] Please follow instruction sheet, as given.  Follow up with Dr. Antoine Poche after these tests are complete  Continue your current medications as directed

## 2011-03-30 NOTE — Progress Notes (Signed)
Exercise Treadmill Test  Pre-Exercise Testing Evaluation Rhythm: normal sinus  Rate: 70   PR:  .15 QRS:  .08  QT:  40 QTc: 43     Test  Exercise Tolerance Test Ordering MD: Angelina Sheriff, MD  Interpreting MD:  Angelina Sheriff, MD  Unique Test No: 1  Treadmill:  1  Indication for ETT: known ASHD  Contraindication to ETT: No   Stress Modality: exercise - treadmill  Cardiac Imaging Performed: non   Protocol: standard Bruce - maximal  Max BP:  134/78  Max MPHR (bpm):  147 85% MPR (bpm):  124  MPHR obtained (bpm):  130 % MPHR obtained:  88  Reached 85% MPHR (min:sec):  2:00 Total Exercise Time (min-sec):  3.04  Workload in METS:  4.6 Borg Scale: 16  Reason ETT Terminated:  dyspnea    ST Segment Analysis At Rest: normal ST segments - no evidence of significant ST depression With Exercise: non-specific ST changes  Other Information Arrhythmia:  No Angina during ETT:  absent (0) Quality of ETT:  non-diagnostic  ETT Interpretation:  borderline (indeterminate) with non-specific ST changes  Comments: The patient had a poor exercise tolerance.  There was no chest pain.  There was an increased level of dyspnea.  There were no arrhythmias, a increased heart rate response and normal BP response.  There were nonspecific ST T wave changes not meeting criteria for ischemia.  Baseline artifact made interpretation difficult   Recommendations: The patient denies any symptoms.  He has had a distant heart cath with an occluded RCA.  He had nonobstructive disease elsewhere. It has been several years since further evaluation. I am going to follow this study with an echocardiogram to further evaluate his ejection fraction which had been slightly low previously. I will also Lexiscan Myoview.  He will have an abnormal inferior wall but I will try to interpret this study in the context of his known coronary disease.

## 2011-04-11 ENCOUNTER — Ambulatory Visit (HOSPITAL_COMMUNITY): Payer: Medicare Other | Attending: Cardiology | Admitting: Radiology

## 2011-04-11 VITALS — Ht 70.0 in | Wt 191.0 lb

## 2011-04-11 DIAGNOSIS — I059 Rheumatic mitral valve disease, unspecified: Secondary | ICD-10-CM | POA: Insufficient documentation

## 2011-04-11 DIAGNOSIS — R0989 Other specified symptoms and signs involving the circulatory and respiratory systems: Secondary | ICD-10-CM | POA: Insufficient documentation

## 2011-04-11 DIAGNOSIS — I079 Rheumatic tricuspid valve disease, unspecified: Secondary | ICD-10-CM | POA: Insufficient documentation

## 2011-04-11 DIAGNOSIS — R002 Palpitations: Secondary | ICD-10-CM | POA: Insufficient documentation

## 2011-04-11 DIAGNOSIS — I10 Essential (primary) hypertension: Secondary | ICD-10-CM | POA: Insufficient documentation

## 2011-04-11 DIAGNOSIS — R0609 Other forms of dyspnea: Secondary | ICD-10-CM | POA: Insufficient documentation

## 2011-04-11 DIAGNOSIS — I255 Ischemic cardiomyopathy: Secondary | ICD-10-CM

## 2011-04-11 DIAGNOSIS — E785 Hyperlipidemia, unspecified: Secondary | ICD-10-CM | POA: Insufficient documentation

## 2011-04-11 DIAGNOSIS — R000 Tachycardia, unspecified: Secondary | ICD-10-CM | POA: Insufficient documentation

## 2011-04-11 DIAGNOSIS — I379 Nonrheumatic pulmonary valve disorder, unspecified: Secondary | ICD-10-CM | POA: Insufficient documentation

## 2011-04-11 DIAGNOSIS — R0602 Shortness of breath: Secondary | ICD-10-CM

## 2011-04-11 DIAGNOSIS — I2510 Atherosclerotic heart disease of native coronary artery without angina pectoris: Secondary | ICD-10-CM

## 2011-04-11 DIAGNOSIS — R9430 Abnormal result of cardiovascular function study, unspecified: Secondary | ICD-10-CM | POA: Insufficient documentation

## 2011-04-11 DIAGNOSIS — I252 Old myocardial infarction: Secondary | ICD-10-CM | POA: Insufficient documentation

## 2011-04-11 DIAGNOSIS — Z87891 Personal history of nicotine dependence: Secondary | ICD-10-CM | POA: Insufficient documentation

## 2011-04-11 MED ORDER — TECHNETIUM TC 99M TETROFOSMIN IV KIT
33.0000 | PACK | Freq: Once | INTRAVENOUS | Status: AC | PRN
  Administered 2011-04-11: 33 via INTRAVENOUS

## 2011-04-11 MED ORDER — REGADENOSON 0.4 MG/5ML IV SOLN
0.4000 mg | Freq: Once | INTRAVENOUS | Status: AC
  Administered 2011-04-11: 0.4 mg via INTRAVENOUS

## 2011-04-11 MED ORDER — TECHNETIUM TC 99M TETROFOSMIN IV KIT
11.0000 | PACK | Freq: Once | INTRAVENOUS | Status: AC | PRN
  Administered 2011-04-11: 11 via INTRAVENOUS

## 2011-04-11 NOTE — Progress Notes (Signed)
[facility] SITE 3 NUCLEAR MED [address] [place] [address] [phone]  Cardiology Nuclear Med Study  Juan Mcdaniel is a 75 y.o. male [id number] [birth year]   Nuclear Med Background Indication for Stress Test:  Evaluation for Ischemia and Stent Patency History: 05/2005 Echo: EF 50%, 03/30/11 GXT: Non specific changes poor exercise tolerance, 12/07 Heart Catheterization: EF 54% N/O DZ patent stent(LAD) , 2006 Myocardial Infarction: Anterior and 2006 Stents: LAD x2 Cardiac Risk Factors: History of Smoking, Hypertension and Lipids  Symptoms:  DOE, Fatigue, Palpitations and Rapid HR   Nuclear Pre-Procedure Caffeine/Decaff Intake:  None NPO After: 7:00pm   Lungs:  clear IV 0.9% NS with Angio Cath:  20g  IV Site: R Antecubital  IV Started by:  Stanton Kidney, EMT-P  Chest Size (in):  42 Cup Size: n/a  Height: 5\' 10"  (1.778 m)  Weight:  191 lb (86.637 kg)  BMI:  Body mass index is 27.41 kg/(m^2). Tech Comments:  Carvedilol held, per patient.    Nuclear Med Study 1 or 2 day study: 1 day  Stress Test Type:  Treadmill/Lexiscan  Reading MD: Olga Millers, MD  Order Authorizing Provider:  J.Hochrein  Resting Radionuclide: Technetium 16m Tetrofosmin  Resting Radionuclide Dose: 11.0 mCi   Stress Radionuclide:  Technetium 85m Tetrofosmin  Stress Radionuclide Dose: 33.0 mCi           Stress Protocol Rest HR: 67 Stress HR: 125  Rest BP: 98/76 Stress BP: 96/68  Exercise Time (min): n/a METS: n/a   Predicted Max HR: 147 bpm % Max HR: 85.03 bpm Rate Pressure Product: 21308   Dose of Adenosine (mg):  n/a Dose of Lexiscan: 0.4 mg  Dose of Atropine (mg): n/a Dose of Dobutamine: n/a mcg/kg/min (at max HR)  Stress Test Technologist: Milana Na, EMT-P  Nuclear Technologist:  Doyne Keel, CNMT     Rest Procedure:  Myocardial perfusion imaging was performed at rest 45 minutes following the intravenous administration of Technetium 34m Tetrofosmin. Rest ECG:  NSR  Stress Procedure:  The patient received IV Lexiscan 0.4 mg over 15-seconds with concurrent low level exercise and then Technetium 26m Tetrofosmin was injected at 30-seconds while the patient continued walking one more minute.  There were no significant changes, +sob, and rare pvcs with Lexiscan.  Quantitative spect images were obtained after a 45-minute delay. Stress ECG: No significant ST segment change suggestive of ischemia.  QPS Raw Data Images:  Acquisition technically good; mild LVE. Stress Images:  There is decreased uptake in the anteroseptal wall and apex. Rest Images:  There is decreased uptake in the anterior wall (slightly less prominent compared to the stress images) and apex. Subtraction (SDS):  These findings are consistent with prior infarct and very mild peri-infarct ischemia. Transient Ischemic Dilatation (Normal <1.22):  1.05 Lung/Heart Ratio (Normal <0.45):  0.53  Quantitative Gated Spect Images QGS EDV:  105 ml QGS ESV:  54 ml QGS cine images:  Akinesis of the anteroseptal wall and apex. QGS EF: 48%  Impression Exercise Capacity:  Lexiscan with no exercise. BP Response:  Normal blood pressure response. Clinical Symptoms:  No chest pain. ECG Impression:  No significant ST segment change suggestive of ischemia. Comparison with Prior Nuclear Study: No images to compare  Overall Impression:  Abnormal stress nuclear study with a large predominantly fixed defect in the anteroseptal and apical walls consistent with prior infarct; very mild reversibility in the anterior wall suggestive of mild peri-infarct ischemia.  Olga Millers

## 2011-04-14 NOTE — Progress Notes (Signed)
Pt was notified.  

## 2011-04-18 ENCOUNTER — Ambulatory Visit (INDEPENDENT_AMBULATORY_CARE_PROVIDER_SITE_OTHER): Payer: Medicare Other | Admitting: Cardiology

## 2011-04-18 ENCOUNTER — Encounter: Payer: Self-pay | Admitting: Cardiology

## 2011-04-18 DIAGNOSIS — I2510 Atherosclerotic heart disease of native coronary artery without angina pectoris: Secondary | ICD-10-CM

## 2011-04-18 DIAGNOSIS — I10 Essential (primary) hypertension: Secondary | ICD-10-CM

## 2011-04-18 DIAGNOSIS — E785 Hyperlipidemia, unspecified: Secondary | ICD-10-CM

## 2011-04-18 NOTE — Progress Notes (Signed)
HPI The patient presents for follow up of CAD.  For follow up testing of his long history of CAD I tried to do an ETT.  Ever he had a poor exercise tolerance but no ischemic ST-T wave changes. I subsequently sent him for a stress perfusion study which demonstrated his EF to be 48% which was about his previous. He had a large inferior infarct consistent with his previous right coronary occlusion. There was some apical anteroseptal ischemia with mild reversibility.  Today he comes back to talk about this. He reports that he is vigorously active. He gets his heart rate up typically with his usual exercises. He says he's just not used to the incline that we had on the treadmill. He thinks his exercise tolerance is fine. He's not having any new shortness of breath, PND or orthopnea. He denies any chest pressure, neck or arm discomfort. He's had no palpitations, presyncope or syncope. He denies any weight gain or edema.   No Known Allergies  Current Outpatient Prescriptions  Medication Sig Dispense Refill  . aspirin 81 MG tablet Take 81 mg by mouth daily.        Alphonsus Sias Pollen 500 MG TABS Take by mouth 2 (two) times daily.        . carvedilol (COREG) 25 MG tablet Take 1 tablet (25 mg total) by mouth 2 (two) times daily with a meal.  90 tablet  3  . clopidogrel (PLAVIX) 75 MG tablet Take 1 tablet (75 mg total) by mouth daily.  90 tablet  3  . doxazosin (CARDURA) 8 MG tablet Take 1 tablet (8 mg total) by mouth at bedtime. Take 1/2 tablet by mouth once a day  45 tablet  3  . Misc Natural Products (OSTEO BI-FLEX ADV JOINT SHIELD) TABS Take by mouth 2 (two) times daily.        . Multiple Vitamins-Minerals (ONE-A-DAY MENS VITACRAVES PO) Take by mouth daily.        . nitroGLYCERIN (NITROSTAT) 0.4 MG SL tablet Place 1 tablet (0.4 mg total) under the tongue every 5 (five) minutes as needed.  25 tablet  11  . simvastatin (ZOCOR) 20 MG tablet Take 1 tablet (20 mg total) by mouth at bedtime.  90 tablet  3  .  travoprost, benzalkonium, (TRAVATAN) 0.004 % ophthalmic solution 1 drop at bedtime.        . valsartan (DIOVAN) 40 MG tablet Take 1 tablet (40 mg total) by mouth daily.  90 tablet  3  . VOLTAREN 1 % GEL APPLY 2 GRAMS EXTERNALLY 4 TIMES DAILY  500 g  2    Past Medical History  Diagnosis Date  . ALLERGIC RHINITIS 07/23/2007  . ANXIETY 07/23/2007  . BENIGN PROSTATIC HYPERTROPHY, HX OF 06/14/2007  . BENIGN PROSTATIC HYPERTROPHY 07/23/2007  . CARDIOMYOPATHY 02/10/2009  . CARPAL TUNNEL SYNDROME, LEFT 07/16/2009  . CORONARY ARTERY DISEASE 06/14/2007  . DEGENERATIVE JOINT DISEASE, KNEES, BILATERAL 10/03/2007  . DEPRESSION 06/14/2007  . FATIGUE 02/15/2010  . GLAUCOMA 06/14/2007  . GLUCOSE INTOLERANCE 02/15/2010  . HYPERCHOLESTEROLEMIA 02/10/2009  . HYPERLIPIDEMIA 06/14/2007  . HYPERTENSION 06/14/2007  . MYOCARDIAL INFARCTION, HX OF 07/23/2007  . OSTEOARTHRITIS 02/10/2009  . SINUSITIS- ACUTE-NOS 07/23/2007  . URI 10/20/2008    Past Surgical History  Procedure Date  . Turp vaporization 05/15/85    ROS:  As stated in the HPI and negative for all other systems.  PHYSICAL EXAM BP 118/72  Pulse 68  Resp 18  Ht 5\' 10"  (1.778  m)  Wt 191 lb 12.8 oz (87 kg)  BMI 27.52 kg/m2 GENERAL:  Well appearing HEENT:  Pupils equal round and reactive, fundi not visualized, oral mucosa unremarkable NECK:  No jugular venous distention, waveform within normal limits, carotid upstroke brisk and symmetric, no bruits, no thyromegaly LYMPHATICS:  No cervical, inguinal adenopathy LUNGS:  Clear to auscultation bilaterally HEART:  PMI not displaced or sustained,S1 and S2 within normal limits, no S3, no S4, no clicks, no rubs, no murmurs ABD:  Flat, positive bowel sounds normal in frequency in pitch, no bruits, no rebound, no guarding, no midline pulsatile mass, no hepatomegaly, no splenomegaly EXT:  2 plus pulses throughout, no edema, no cyanosis no clubbing  ASSESSMENT AND PLAN

## 2011-04-18 NOTE — Patient Instructions (Signed)
Follow up in 1 year with Dr Hochrein.  You will receive a letter in the mail 2 months before you are due.  Please call us when you receive this letter to schedule your follow up appointment.  The current medical regimen is effective;  continue present plan and medications.  

## 2011-04-18 NOTE — Assessment & Plan Note (Signed)
The blood pressure is at target. No change in medications is indicated. We will continue with therapeutic lifestyle changes (TLC).  

## 2011-04-18 NOTE — Assessment & Plan Note (Signed)
His lipids earlier this your excellent. He will continue meds as listed.

## 2011-04-18 NOTE — Assessment & Plan Note (Signed)
I had a very long discussion with the patient concerning the results of his study. At this point much of the changes are explainable by the previously occluded right coronary with extensive collateralization. Pressing him on his level of activity he does have a high functional level and absolutely no symptoms. I think therefore this represents a low risk scan and he would prefer conservative management. Therefore, cardiac catheterization is not indicated. We will aggressively pursue risk reduction. He understands that should he have any change in his clinical status he would let me know. He is happy with this plan.

## 2011-05-25 DIAGNOSIS — N400 Enlarged prostate without lower urinary tract symptoms: Secondary | ICD-10-CM | POA: Diagnosis not present

## 2011-06-23 DIAGNOSIS — Z85828 Personal history of other malignant neoplasm of skin: Secondary | ICD-10-CM | POA: Diagnosis not present

## 2011-06-23 DIAGNOSIS — H409 Unspecified glaucoma: Secondary | ICD-10-CM | POA: Diagnosis not present

## 2011-06-23 DIAGNOSIS — H4010X0 Unspecified open-angle glaucoma, stage unspecified: Secondary | ICD-10-CM | POA: Diagnosis not present

## 2011-06-23 DIAGNOSIS — H2510 Age-related nuclear cataract, unspecified eye: Secondary | ICD-10-CM | POA: Diagnosis not present

## 2011-06-23 DIAGNOSIS — L570 Actinic keratosis: Secondary | ICD-10-CM | POA: Diagnosis not present

## 2011-06-23 DIAGNOSIS — H04129 Dry eye syndrome of unspecified lacrimal gland: Secondary | ICD-10-CM | POA: Diagnosis not present

## 2011-06-23 DIAGNOSIS — D485 Neoplasm of uncertain behavior of skin: Secondary | ICD-10-CM | POA: Diagnosis not present

## 2011-06-23 DIAGNOSIS — D044 Carcinoma in situ of skin of scalp and neck: Secondary | ICD-10-CM | POA: Diagnosis not present

## 2011-07-25 DIAGNOSIS — D044 Carcinoma in situ of skin of scalp and neck: Secondary | ICD-10-CM | POA: Diagnosis not present

## 2011-08-22 ENCOUNTER — Ambulatory Visit (INDEPENDENT_AMBULATORY_CARE_PROVIDER_SITE_OTHER): Payer: Medicare Other | Admitting: Internal Medicine

## 2011-08-22 VITALS — BP 108/70 | HR 76 | Temp 97.7°F | Ht 70.0 in | Wt 200.2 lb

## 2011-08-22 DIAGNOSIS — I10 Essential (primary) hypertension: Secondary | ICD-10-CM

## 2011-08-22 DIAGNOSIS — J309 Allergic rhinitis, unspecified: Secondary | ICD-10-CM | POA: Diagnosis not present

## 2011-08-22 DIAGNOSIS — J3489 Other specified disorders of nose and nasal sinuses: Secondary | ICD-10-CM

## 2011-08-22 MED ORDER — MUPIROCIN 2 % EX OINT: TOPICAL_OINTMENT | Freq: Three times a day (TID) | CUTANEOUS | Status: AC

## 2011-08-22 MED ORDER — FLUTICASONE PROPIONATE 50 MCG/ACT NA SUSP: 2.0000 | Freq: Every day | NASAL | Status: DC

## 2011-08-22 NOTE — Patient Instructions (Signed)
Take all new medications as prescribed Continue all other medications as before Please return in 3 months, or sooner if needed 

## 2011-08-27 ENCOUNTER — Encounter: Payer: Self-pay | Admitting: Internal Medicine

## 2011-08-27 NOTE — Progress Notes (Signed)
Subjective:    Patient ID: Juan Mcdaniel, male    DOB: 01-06-1936, 76 y.o.   MRN: [id number]  HPI  Here to f/u with c/o soreness and scabby sores to inner nasal area, no drainage or nasal swelling, bleeding or fever.  Pt denies chest pain, increased sob or doe, wheezing, orthopnea, PND, increased LE swelling, palpitations, dizziness or syncope.   Pt denies polydipsia, polyuria  Pt states overall good compliance with meds, trying to follow lower cholesterol diet, wt overall stable.  Pt denies new neurological symptoms such as new headache, or facial or extremity weakness or numbness  Does have several wks ongoing nasal allergy symptoms with clear congestion, itch and sneeze, without fever, pain, ST, cough or wheezing. Past Medical History  Diagnosis Date  . ALLERGIC RHINITIS 07/23/2007  . ANXIETY 07/23/2007  . BENIGN PROSTATIC HYPERTROPHY, HX OF 06/14/2007  . BENIGN PROSTATIC HYPERTROPHY 07/23/2007  . CARDIOMYOPATHY 02/10/2009  . CARPAL TUNNEL SYNDROME, LEFT 07/16/2009  . CORONARY ARTERY DISEASE 06/14/2007  . DEGENERATIVE JOINT DISEASE, KNEES, BILATERAL 10/03/2007  . DEPRESSION 06/14/2007  . FATIGUE 02/15/2010  . GLAUCOMA 06/14/2007  . GLUCOSE INTOLERANCE 02/15/2010  . HYPERCHOLESTEROLEMIA 02/10/2009  . HYPERLIPIDEMIA 06/14/2007  . HYPERTENSION 06/14/2007  . MYOCARDIAL INFARCTION, HX OF 07/23/2007  . OSTEOARTHRITIS 02/10/2009  . SINUSITIS- ACUTE-NOS 07/23/2007  . URI 10/20/2008   Past Surgical History  Procedure Date  . Turp vaporization 05/15/85    reports that he has quit smoking. He does not have any smokeless tobacco history on file. He reports that he drinks alcohol. He reports that he does not use illicit drugs. family history is not on file. No Known Allergies Current Outpatient Prescriptions on File Prior to Visit  Medication Sig Dispense Refill  . aspirin 81 MG tablet Take 81 mg by mouth daily.        Alphonsus Sias Pollen 500 MG TABS Take by mouth 2 (two) times daily.        . carvedilol (COREG)  25 MG tablet Take 1 tablet (25 mg total) by mouth 2 (two) times daily with a meal.  90 tablet  3  . clopidogrel (PLAVIX) 75 MG tablet Take 1 tablet (75 mg total) by mouth daily.  90 tablet  3  . doxazosin (CARDURA) 8 MG tablet Take 1 tablet (8 mg total) by mouth at bedtime. Take 1/2 tablet by mouth once a day  45 tablet  3  . Misc Natural Products (OSTEO BI-FLEX ADV JOINT SHIELD) TABS Take by mouth 2 (two) times daily.        . Multiple Vitamins-Minerals (ONE-A-DAY MENS VITACRAVES PO) Take by mouth daily.        . nitroGLYCERIN (NITROSTAT) 0.4 MG SL tablet Place 1 tablet (0.4 mg total) under the tongue every 5 (five) minutes as needed.  25 tablet  11  . simvastatin (ZOCOR) 20 MG tablet Take 1 tablet (20 mg total) by mouth at bedtime.  90 tablet  3  . travoprost, benzalkonium, (TRAVATAN) 0.004 % ophthalmic solution 1 drop at bedtime.        . valsartan (DIOVAN) 40 MG tablet Take 1 tablet (40 mg total) by mouth daily.  90 tablet  3  . VOLTAREN 1 % GEL APPLY 2 GRAMS EXTERNALLY 4 TIMES DAILY  500 g  2  . fluticasone (FLONASE) 50 MCG/ACT nasal spray Place 2 sprays into the nose daily.  16 g  2   Review of Systems Review of Systems  Constitutional: Negative for diaphoresis and unexpected  weight change.  HENT: Negative for drooling and tinnitus.   Eyes: Negative for photophobia and visual disturbance.  Respiratory: Negative for choking and stridor.   Gastrointestinal: Negative for vomiting and blood in stool.  Genitourinary: Negative for hematuria and decreased urine volume.  Musculoskeletal: Negative for gait problem.  Skin: Negative for color change and wound.    Objective:   Physical Exam BP 108/70  Pulse 76  Temp(Src) 97.7 F (36.5 C) (Oral)  Ht 5\' 10"  (1.778 m)  Wt 200 lb 4 oz (90.833 kg)  BMI 28.73 kg/m2  SpO2 96% Physical Exam  VS noted Constitutional: Pt appears well-developed and well-nourished.  HENT: Head: Normocephalic.  Right Ear: External ear normal.  Left Ear: External ear  normal.  Left nasal with soreness/edema/erythema without drainage or nasal cellulitis Eyes: Conjunctivae and EOM are normal. Pupils are equal, round, and reactive to light.  Neck: Normal range of motion. Neck supple.  Cardiovascular: Normal rate and regular rhythm.   Pulmonary/Chest: Effort normal and breath sounds normal.  Neurological: Pt is alert. No cranial nerve deficit.  Skin: Skin is warm. No erythema.  Psychiatric: Pt behavior is normal. Thought content normal. 1+ nervous    Assessment & Plan:

## 2011-08-27 NOTE — Assessment & Plan Note (Signed)
Mild to mod, for antibx course,  to f/u any worsening symptoms or concerns 

## 2011-08-27 NOTE — Assessment & Plan Note (Signed)
For flonae re-start asd,  to f/u any worsening symptoms or concerns

## 2011-08-27 NOTE — Assessment & Plan Note (Signed)
stable overall by hx and exam, most recent data reviewed with pt, and pt to continue medical treatment as before  BP Readings from Last 3 Encounters:  08/22/11 108/70  04/18/11 118/72  03/16/11 110/66

## 2011-11-30 DIAGNOSIS — L570 Actinic keratosis: Secondary | ICD-10-CM | POA: Diagnosis not present

## 2011-11-30 DIAGNOSIS — Z85828 Personal history of other malignant neoplasm of skin: Secondary | ICD-10-CM | POA: Diagnosis not present

## 2012-01-02 DIAGNOSIS — H2510 Age-related nuclear cataract, unspecified eye: Secondary | ICD-10-CM | POA: Diagnosis not present

## 2012-01-02 DIAGNOSIS — H409 Unspecified glaucoma: Secondary | ICD-10-CM | POA: Diagnosis not present

## 2012-02-28 DIAGNOSIS — Z23 Encounter for immunization: Secondary | ICD-10-CM | POA: Diagnosis not present

## 2012-03-30 ENCOUNTER — Other Ambulatory Visit: Payer: Self-pay | Admitting: Cardiology

## 2012-04-09 ENCOUNTER — Telehealth: Payer: Self-pay | Admitting: *Deleted

## 2012-04-09 MED ORDER — LOSARTAN POTASSIUM 25 MG PO TABS: 25.0000 mg | ORAL_TABLET | Freq: Every day | ORAL | Status: DC

## 2012-04-09 NOTE — Telephone Encounter (Signed)
Received paperwork from [employer] stating Diovan is not on the pts formulary for next year and the medication needs to be changed to something that is.  Per Dr Antoine Poche pt will be changed to Cozaar 25 mg a day.  He will be contacted to schedule follow up with Dr Antoine Poche.

## 2012-04-19 ENCOUNTER — Encounter: Payer: Self-pay | Admitting: Cardiology

## 2012-04-19 ENCOUNTER — Ambulatory Visit (INDEPENDENT_AMBULATORY_CARE_PROVIDER_SITE_OTHER): Payer: Medicare Other | Admitting: Cardiology

## 2012-04-19 VITALS — BP 114/80 | HR 80 | Ht 70.0 in | Wt 196.0 lb

## 2012-04-19 DIAGNOSIS — E785 Hyperlipidemia, unspecified: Secondary | ICD-10-CM | POA: Diagnosis not present

## 2012-04-19 DIAGNOSIS — I10 Essential (primary) hypertension: Secondary | ICD-10-CM

## 2012-04-19 DIAGNOSIS — I2510 Atherosclerotic heart disease of native coronary artery without angina pectoris: Secondary | ICD-10-CM

## 2012-04-19 DIAGNOSIS — R7309 Other abnormal glucose: Secondary | ICD-10-CM | POA: Diagnosis not present

## 2012-04-19 LAB — LIPID PANEL
HDL: 39.9 mg/dL (ref >39.00)
Total CHOL/HDL Ratio: 2
VLDL: 11.6 mg/dL (ref 0.0–40.0)

## 2012-04-19 LAB — HEMOGLOBIN A1C: Hgb A1c MFr Bld: 5.5 % (ref 4.6–6.5)

## 2012-04-19 NOTE — Patient Instructions (Addendum)
Please stop your Plavix.  Continue all other medications as listed.  Please have blood work today.  Follow up in 1 year with Dr Antoine Poche.  You will receive a letter in the mail 2 months before you are due.  Please call us when you receive this letter to schedule your follow up appointment.

## 2012-04-19 NOTE — Progress Notes (Signed)
HPI The patient presents for follow up of CAD.  In 2012 I tried to do an ETT.  Ever he had a poor exercise tolerance but no ischemic ST-T wave changes. I subsequently sent him for a stress perfusion study which demonstrated his EF to be 48% which was about his previous. He had a large inferior infarct consistent with his previous right coronary occlusion. There was some apical anteroseptal ischemia with mild reversibility.  In the absence of symptoms we managed this medically.  He returns for yearly follow up.  He reports that he is vigorously active. He is still working.   He thinks his exercise tolerance is fine. He's not having any new shortness of breath, PND or orthopnea. He denies any chest pressure, neck or arm discomfort. He's had no palpitations, presyncope or syncope. He denies any weight gain or edema.   No Known Allergies  Current Outpatient Prescriptions  Medication Sig Dispense Refill  . aspirin 81 MG tablet Take 81 mg by mouth daily.        . carvedilol (COREG) 25 MG tablet       . clopidogrel (PLAVIX) 75 MG tablet TAKE ONE TABLET BY MOUTH DAILY.  90 tablet  2  . niacin 500 MG tablet Take 500 mg by mouth daily. 4 tablets daily 2000mg       . NITROSTAT 0.4 MG SL tablet DISSOLVE ONE TABLET UNDER THE TONGUE EVERY 5 MINUTES AS NEEDED.  25 tablet  10  . simvastatin (ZOCOR) 20 MG tablet TAKE ONE TABLET BY MOUTH AT BEDTIME  90 tablet  2  . travoprost, benzalkonium, (TRAVATAN) 0.004 % ophthalmic solution 1 drop at bedtime.        . valsartan (DIOVAN) 40 MG tablet Take 40 mg by mouth daily.      . [DISCONTINUED] carvedilol (COREG) 25 MG tablet TAKE ONE TABLET BY MOUTH TWICE DAILY WITH A MEAL.  90 tablet  2    Past Medical History  Diagnosis Date  . ALLERGIC RHINITIS 07/23/2007  . ANXIETY 07/23/2007  . BENIGN PROSTATIC HYPERTROPHY, HX OF 06/14/2007  . BENIGN PROSTATIC HYPERTROPHY 07/23/2007  . CARDIOMYOPATHY 02/10/2009  . CARPAL TUNNEL SYNDROME, LEFT 07/16/2009  . CORONARY ARTERY  DISEASE 06/14/2007  . DEGENERATIVE JOINT DISEASE, KNEES, BILATERAL 10/03/2007  . DEPRESSION 06/14/2007  . FATIGUE 02/15/2010  . GLAUCOMA 06/14/2007  . GLUCOSE INTOLERANCE 02/15/2010  . HYPERCHOLESTEROLEMIA 02/10/2009  . HYPERLIPIDEMIA 06/14/2007  . HYPERTENSION 06/14/2007  . MYOCARDIAL INFARCTION, HX OF 07/23/2007  . OSTEOARTHRITIS 02/10/2009  . SINUSITIS- ACUTE-NOS 07/23/2007  . URI 10/20/2008    Past Surgical History  Procedure Date  . Turp vaporization 05/15/85    ROS:  As stated in the HPI and negative for all other systems.  PHYSICAL EXAM BP 114/80  Pulse 80  Ht 5\' 10"  (1.778 m)  Wt 196 lb (88.905 kg)  BMI 28.12 kg/m2 GENERAL:  Well appearing HEENT:  Pupils equal round and reactive, fundi not visualized, oral mucosa unremarkable NECK:  No jugular venous distention, waveform within normal limits, carotid upstroke brisk and symmetric, no bruits, no thyromegaly LUNGS:  Clear to auscultation bilaterally HEART:  PMI not displaced or sustained,S1 and S2 within normal limits, no S3, no S4, no clicks, no rubs, no murmurs ABD:  Flat, positive bowel sounds normal in frequency in pitch, no bruits, no rebound, no guarding, no midline pulsatile mass, no hepatomegaly, no splenomegaly EXT:  2 plus pulses throughout, no edema, no cyanosis no clubbing  ASSESSMENT AND PLAN  CORONARY  ARTERY DISEASE -  The patient has had no new symptoms since his stress perfusion study last year. At this point we will continue with aggressive risk reduction. No further cardiovascular testing is suggested.  He can stop his Plavix.  HYPERLIPIDEMIA -  I will check a lipid profile today with a goal LDL less than 100 and HDL greater than 40.  HYPERTENSION -  The blood pressure is at target. No change in medications is indicated. We will continue with therapeutic lifestyle changes (TLC).

## 2012-04-26 ENCOUNTER — Encounter: Payer: Self-pay | Admitting: *Deleted

## 2012-06-14 DIAGNOSIS — N400 Enlarged prostate without lower urinary tract symptoms: Secondary | ICD-10-CM | POA: Diagnosis not present

## 2012-06-14 DIAGNOSIS — R351 Nocturia: Secondary | ICD-10-CM | POA: Diagnosis not present

## 2012-06-14 DIAGNOSIS — Z125 Encounter for screening for malignant neoplasm of prostate: Secondary | ICD-10-CM | POA: Diagnosis not present

## 2012-07-02 DIAGNOSIS — H4010X0 Unspecified open-angle glaucoma, stage unspecified: Secondary | ICD-10-CM | POA: Diagnosis not present

## 2012-07-02 DIAGNOSIS — H409 Unspecified glaucoma: Secondary | ICD-10-CM | POA: Diagnosis not present

## 2012-11-11 DIAGNOSIS — H2510 Age-related nuclear cataract, unspecified eye: Secondary | ICD-10-CM | POA: Diagnosis not present

## 2012-11-11 DIAGNOSIS — H269 Unspecified cataract: Secondary | ICD-10-CM | POA: Diagnosis not present

## 2012-12-18 DIAGNOSIS — D485 Neoplasm of uncertain behavior of skin: Secondary | ICD-10-CM | POA: Diagnosis not present

## 2012-12-18 DIAGNOSIS — L570 Actinic keratosis: Secondary | ICD-10-CM | POA: Diagnosis not present

## 2012-12-28 ENCOUNTER — Other Ambulatory Visit: Payer: Self-pay | Admitting: Cardiology

## 2013-01-01 ENCOUNTER — Other Ambulatory Visit: Payer: Self-pay

## 2013-01-01 MED ORDER — VALSARTAN 40 MG PO TABS: 40.0000 mg | ORAL_TABLET | Freq: Every day | ORAL | Status: DC

## 2013-01-22 DIAGNOSIS — L570 Actinic keratosis: Secondary | ICD-10-CM | POA: Diagnosis not present

## 2013-03-20 ENCOUNTER — Other Ambulatory Visit: Payer: Self-pay | Admitting: Internal Medicine

## 2013-04-23 ENCOUNTER — Ambulatory Visit (INDEPENDENT_AMBULATORY_CARE_PROVIDER_SITE_OTHER): Payer: Medicare Other | Admitting: Cardiology

## 2013-04-23 ENCOUNTER — Encounter: Payer: Self-pay | Admitting: Cardiology

## 2013-04-23 VITALS — BP 90/62 | HR 65 | Ht 70.0 in | Wt 196.0 lb

## 2013-04-23 DIAGNOSIS — Z79899 Other long term (current) drug therapy: Secondary | ICD-10-CM | POA: Diagnosis not present

## 2013-04-23 DIAGNOSIS — R7309 Other abnormal glucose: Secondary | ICD-10-CM

## 2013-04-23 DIAGNOSIS — I428 Other cardiomyopathies: Secondary | ICD-10-CM

## 2013-04-23 DIAGNOSIS — R7302 Impaired glucose tolerance (oral): Secondary | ICD-10-CM

## 2013-04-23 DIAGNOSIS — I10 Essential (primary) hypertension: Secondary | ICD-10-CM

## 2013-04-23 DIAGNOSIS — E7800 Pure hypercholesterolemia, unspecified: Secondary | ICD-10-CM

## 2013-04-23 DIAGNOSIS — I2510 Atherosclerotic heart disease of native coronary artery without angina pectoris: Secondary | ICD-10-CM | POA: Diagnosis not present

## 2013-04-23 LAB — HEPATIC FUNCTION PANEL
ALT: 21 U/L (ref 0–53)
AST: 24 U/L (ref 0–37)
Alkaline Phosphatase: 55 U/L (ref 39–117)
Bilirubin, Direct: 0.2 mg/dL (ref 0.0–0.3)

## 2013-04-23 LAB — LIPID PANEL
LDL Cholesterol: 53 mg/dL (ref 0–99)
Total CHOL/HDL Ratio: 2

## 2013-04-23 LAB — HEMOGLOBIN A1C: Hgb A1c MFr Bld: 5.4 % (ref 4.6–6.5)

## 2013-04-23 MED ORDER — LOSARTAN POTASSIUM 25 MG PO TABS: 25.0000 mg | ORAL_TABLET | Freq: Every day | ORAL | Status: DC

## 2013-04-23 NOTE — Patient Instructions (Signed)
Please stop Diovan. Start Losartan 25 mg a day. Continue all other medications as listed.  Please have blood work today  (HA1C, lipid and liver profile)  Follow up in 1 year with Dr Antoine Poche.  You will receive a letter in the mail 2 months before you are due.  Please call us when you receive this letter to schedule your follow up appointment.

## 2013-04-23 NOTE — Progress Notes (Signed)
HPI The patient presents for follow up of CAD.  In 2012 I tried to do an ETT.  Ever he had a poor exercise tolerance but no ischemic ST-T wave changes. I subsequently sent him for a stress perfusion study which demonstrated his EF to be 48% which was about his previous. He had a large inferior infarct consistent with his previous right coronary occlusion. There was some apical anteroseptal ischemia with mild reversibility.  In the absence of symptoms we managed this medically.  He returns for yearly follow up.   He is still working but not a vigorous job.Juan Mcdaniel   He thinks his exercise tolerance is fine. He's not having any new shortness of breath, PND or orthopnea. He denies any chest pressure, neck or arm discomfort. He's had no palpitations, presyncope or syncope. He denies any weight gain or edema.     No Known Allergies  Current Outpatient Prescriptions  Medication Sig Dispense Refill  . aspirin 81 MG tablet Take 81 mg by mouth daily.        . carvedilol (COREG) 25 MG tablet       . carvedilol (COREG) 25 MG tablet TAKE ONE TABLET BY MOUTH TWICE DAILY WITH MEALS  90 tablet  3  . niacin 500 MG tablet Take 500 mg by mouth daily. 4 tablets daily 2000mg       . NITROSTAT 0.4 MG SL tablet DISSOLVE ONE TABLET UNDER THE TONGUE EVERY 5 MINUTES AS NEEDED.  25 tablet  10  . simvastatin (ZOCOR) 20 MG tablet TAKE ONE TABLET BY MOUTH AT BEDTIME  90 tablet  3  . tamsulosin (FLOMAX) 0.4 MG CAPS capsule Take 0.4 mg by mouth daily after supper.      . travoprost, benzalkonium, (TRAVATAN) 0.004 % ophthalmic solution 1 drop at bedtime.        . valsartan (DIOVAN) 40 MG tablet Take 1 tablet (40 mg total) by mouth daily.  30 tablet  3   No current facility-administered medications for this visit.    Past Medical History  Diagnosis Date  . ALLERGIC RHINITIS 07/23/2007  . ANXIETY 07/23/2007  . BENIGN PROSTATIC HYPERTROPHY, HX OF 06/14/2007  . BENIGN PROSTATIC HYPERTROPHY 07/23/2007  . CARDIOMYOPATHY 02/10/2009   . CARPAL TUNNEL SYNDROME, LEFT 07/16/2009  . CORONARY ARTERY DISEASE 06/14/2007  . DEGENERATIVE JOINT DISEASE, KNEES, BILATERAL 10/03/2007  . DEPRESSION 06/14/2007  . FATIGUE 02/15/2010  . GLAUCOMA 06/14/2007  . GLUCOSE INTOLERANCE 02/15/2010  . HYPERCHOLESTEROLEMIA 02/10/2009  . HYPERLIPIDEMIA 06/14/2007  . HYPERTENSION 06/14/2007  . MYOCARDIAL INFARCTION, HX OF 07/23/2007  . OSTEOARTHRITIS 02/10/2009  . SINUSITIS- ACUTE-NOS 07/23/2007  . URI 10/20/2008    Past Surgical History  Procedure Laterality Date  . Turp vaporization  05/15/85    ROS:  As stated in the HPI and negative for all other systems.  PHYSICAL EXAM BP 90/62  Pulse 65  Ht 5\' 10"  (1.778 m)  Wt 196 lb (88.905 kg)  BMI 28.12 kg/m2 GENERAL:  Well appearing HEENT:  Pupils equal round and reactive, fundi not visualized, oral mucosa unremarkable NECK:  No jugular venous distention, waveform within normal limits, carotid upstroke brisk and symmetric, no bruits, no thyromegaly LUNGS:  Clear to auscultation bilaterally HEART:  PMI not displaced or sustained,S1 and S2 within normal limits, no S3, no S4, no clicks, no rubs, no murmurs ABD:  Flat, positive bowel sounds normal in frequency in pitch, no bruits, no rebound, no guarding, no midline pulsatile mass, no hepatomegaly, no splenomegaly EXT:  2 plus pulses throughout, no edema, no cyanosis no clubbing  EKG:     Normal sinus rhythm, rate 65, axis within normal limits, intervals within normal limits, old anteroseptal infarct.  This is unchanged from last year. 04/23/2013   ASSESSMENT AND PLAN  CORONARY ARTERY DISEASE -  The patient has no new sypmtoms.  No further cardiovascular testing is indicated.  We will continue with aggressive risk reduction and meds as listed.  HYPERLIPIDEMIA -   he will get a lipid profile today. Further management will be based on this.  HYPERTENSION -   his blood pressure is slightly low. He wants to switch on 12 valsartan secondary to cost. I will  switch him to Toprol 25 mg daily

## 2013-06-25 DIAGNOSIS — R351 Nocturia: Secondary | ICD-10-CM | POA: Diagnosis not present

## 2013-06-25 DIAGNOSIS — Z85828 Personal history of other malignant neoplasm of skin: Secondary | ICD-10-CM | POA: Diagnosis not present

## 2013-06-25 DIAGNOSIS — Z125 Encounter for screening for malignant neoplasm of prostate: Secondary | ICD-10-CM | POA: Diagnosis not present

## 2013-06-25 DIAGNOSIS — L570 Actinic keratosis: Secondary | ICD-10-CM | POA: Diagnosis not present

## 2013-06-25 DIAGNOSIS — D239 Other benign neoplasm of skin, unspecified: Secondary | ICD-10-CM | POA: Diagnosis not present

## 2013-06-25 DIAGNOSIS — N400 Enlarged prostate without lower urinary tract symptoms: Secondary | ICD-10-CM | POA: Diagnosis not present

## 2013-07-08 DIAGNOSIS — H18419 Arcus senilis, unspecified eye: Secondary | ICD-10-CM | POA: Diagnosis not present

## 2013-07-08 DIAGNOSIS — Z961 Presence of intraocular lens: Secondary | ICD-10-CM | POA: Diagnosis not present

## 2013-07-08 DIAGNOSIS — H4010X0 Unspecified open-angle glaucoma, stage unspecified: Secondary | ICD-10-CM | POA: Diagnosis not present

## 2013-07-08 DIAGNOSIS — H409 Unspecified glaucoma: Secondary | ICD-10-CM | POA: Diagnosis not present

## 2013-11-11 ENCOUNTER — Other Ambulatory Visit (INDEPENDENT_AMBULATORY_CARE_PROVIDER_SITE_OTHER): Payer: Medicare Other

## 2013-11-11 ENCOUNTER — Ambulatory Visit (INDEPENDENT_AMBULATORY_CARE_PROVIDER_SITE_OTHER): Payer: Medicare Other | Admitting: Internal Medicine

## 2013-11-11 ENCOUNTER — Encounter: Payer: Self-pay | Admitting: Internal Medicine

## 2013-11-11 VITALS — BP 92/62 | HR 74 | Temp 98.0°F | Ht 70.0 in | Wt 199.2 lb

## 2013-11-11 DIAGNOSIS — I10 Essential (primary) hypertension: Secondary | ICD-10-CM

## 2013-11-11 DIAGNOSIS — E785 Hyperlipidemia, unspecified: Secondary | ICD-10-CM

## 2013-11-11 DIAGNOSIS — M549 Dorsalgia, unspecified: Secondary | ICD-10-CM | POA: Diagnosis not present

## 2013-11-11 DIAGNOSIS — R7302 Impaired glucose tolerance (oral): Secondary | ICD-10-CM

## 2013-11-11 DIAGNOSIS — R7309 Other abnormal glucose: Secondary | ICD-10-CM

## 2013-11-11 DIAGNOSIS — M5489 Other dorsalgia: Secondary | ICD-10-CM

## 2013-11-11 DIAGNOSIS — Z0000 Encounter for general adult medical examination without abnormal findings: Secondary | ICD-10-CM

## 2013-11-11 LAB — CBC WITH DIFFERENTIAL/PLATELET
Basophils Absolute: 0 10*3/uL (ref 0.0–0.1)
Basophils Relative: 0.3 % (ref 0.0–3.0)
Eosinophils Absolute: 0.2 10*3/uL (ref 0.0–0.7)
Eosinophils Relative: 2.9 % (ref 0.0–5.0)
HEMATOCRIT: 45.1 % (ref 39.0–52.0)
Hemoglobin: 15.4 g/dL (ref 13.0–17.0)
Lymphocytes Relative: 27.6 % (ref 12.0–46.0)
Lymphs Abs: 2.2 10*3/uL (ref 0.7–4.0)
MCHC: 34.1 g/dL (ref 30.0–36.0)
MCV: 90.3 fl (ref 78.0–100.0)
MONOS PCT: 10.1 % (ref 3.0–12.0)
Monocytes Absolute: 0.8 10*3/uL (ref 0.1–1.0)
Neutro Abs: 4.6 10*3/uL (ref 1.4–7.7)
Neutrophils Relative %: 59.1 % (ref 43.0–77.0)
PLATELETS: 211 10*3/uL (ref 150.0–400.0)
RBC: 4.99 Mil/uL (ref 4.22–5.81)
RDW: 12.6 % (ref 11.5–15.5)
WBC: 7.9 10*3/uL (ref 4.0–10.5)

## 2013-11-11 LAB — BASIC METABOLIC PANEL
BUN: 11 mg/dL (ref 6–23)
CALCIUM: 9.5 mg/dL (ref 8.4–10.5)
CO2: 29 mEq/L (ref 19–32)
Chloride: 104 mEq/L (ref 96–112)
Creatinine, Ser: 0.9 mg/dL (ref 0.4–1.5)
GFR: 91.97 mL/min (ref >60.00)
GLUCOSE: 103 mg/dL — AB (ref 70–99)
Potassium: 4.2 mEq/L (ref 3.5–5.1)
Sodium: 136 mEq/L (ref 135–145)

## 2013-11-11 LAB — TSH: TSH: 0.73 u[IU]/mL (ref 0.35–4.50)

## 2013-11-11 MED ORDER — TIZANIDINE HCL 4 MG PO TABS: 4.0000 mg | ORAL_TABLET | Freq: Four times a day (QID) | ORAL | Status: DC | PRN

## 2013-11-11 NOTE — Assessment & Plan Note (Signed)
C/w trapezoid strain, chronic, exam o/w benign, for muscle relaxer prn, consier imaging and/or ortho eval if not improved

## 2013-11-11 NOTE — Progress Notes (Signed)
Subjective:    Patient ID: Juan Mcdaniel, male    DOB: [birth year], 78 y.o.   MRN: [id number]  HPI  Here for yearly f/u;  Overall doing ok;  Pt denies CP, worsening SOB, DOE, wheezing, orthopnea, PND, worsening LE edema, palpitations, dizziness or syncope.  Pt denies neurological change such as new headache, facial or extremity weakness.  Pt denies polydipsia, polyuria, or low sugar symptoms. Pt states overall good compliance with treatment and medications, good tolerability, and has been trying to follow lower cholesterol diet.  Pt denies worsening depressive symptoms, suicidal ideation or panic. No fever, night sweats, wt loss, loss of appetite, or other constitutional symptoms.  Pt states good ability with ADL's, has low fall risk, home safety reviewed and adequate, no other significant changes in hearing or vision, and only occasionally active with exercise. Has some scab like mucous most days with vigourous coughing usually about mid day, no blood.   Pt denies fever, wt loss, night sweats, loss of appetite, or other constitutional symptoms.  BP remains on lower side but denies symtoms.  Also sees yearly cardiology.  Does c/o 6 wks onset pain to left upper back and neck pain (not shoulder), not worse to move left shoulder, but act of blowing the nose with both hands seems to cause pain.No change in ROM of neck, and no radicular LUE pain.  Massage seems to help to left upper back and left lateral neck. No pain with driving car, but some worse with sitting longer time in easy chair.   Had recent a1c/lipids/lft dec 2014 - ok. Last psa nov 2014 per pt with Dr Dahlstedt/urology with yearly f/u. Declines prevnar  Past Medical History  Diagnosis Date  . ALLERGIC RHINITIS 07/23/2007  . ANXIETY 07/23/2007  . BENIGN PROSTATIC HYPERTROPHY, HX OF 06/14/2007  . BENIGN PROSTATIC HYPERTROPHY 07/23/2007  . CARDIOMYOPATHY 02/10/2009  . CARPAL TUNNEL SYNDROME, LEFT 07/16/2009  . CORONARY ARTERY DISEASE 06/14/2007  .  DEGENERATIVE JOINT DISEASE, KNEES, BILATERAL 10/03/2007  . DEPRESSION 06/14/2007  . FATIGUE 02/15/2010  . GLAUCOMA 06/14/2007  . GLUCOSE INTOLERANCE 02/15/2010  . HYPERCHOLESTEROLEMIA 02/10/2009  . HYPERLIPIDEMIA 06/14/2007  . HYPERTENSION 06/14/2007  . MYOCARDIAL INFARCTION, HX OF 07/23/2007  . OSTEOARTHRITIS 02/10/2009  . SINUSITIS- ACUTE-NOS 07/23/2007  . URI 10/20/2008   Past Surgical History  Procedure Laterality Date  . Turp vaporization  05/15/85    reports that he has quit smoking. He does not have any smokeless tobacco history on file. He reports that he drinks alcohol. He reports that he does not use illicit drugs. family history is not on file. No Known Allergies Current Outpatient Prescriptions on File Prior to Visit  Medication Sig Dispense Refill  . aspirin 81 MG tablet Take 81 mg by mouth daily.        . carvedilol (COREG) 25 MG tablet TAKE ONE TABLET BY MOUTH TWICE DAILY WITH MEALS  90 tablet  3  . losartan (COZAAR) 25 MG tablet Take 1 tablet (25 mg total) by mouth daily.  90 tablet  3  . NITROSTAT 0.4 MG SL tablet DISSOLVE ONE TABLET UNDER THE TONGUE EVERY 5 MINUTES AS NEEDED.  25 tablet  10  . simvastatin (ZOCOR) 20 MG tablet TAKE ONE TABLET BY MOUTH AT BEDTIME  90 tablet  3  . tamsulosin (FLOMAX) 0.4 MG CAPS capsule Take 0.4 mg by mouth daily after supper.      . travoprost, benzalkonium, (TRAVATAN) 0.004 % ophthalmic solution 1 drop at bedtime.  No current facility-administered medications on file prior to visit.    Review of Systems Constitutional: Negative for increased diaphoresis, other activity, appetite or other siginficant weight change  HENT: Negative for worsening hearing loss, ear pain, facial swelling, mouth sores and neck stiffness.   Eyes: Negative for other worsening pain, redness or visual disturbance.  Respiratory: Negative for shortness of breath and wheezing.   Cardiovascular: Negative for chest pain and palpitations.  Gastrointestinal: Negative  for diarrhea, blood in stool, abdominal distention or other pain Genitourinary: Negative for hematuria, flank pain or change in urine volume.  Musculoskeletal: Negative for myalgias or other joint complaints.  Skin: Negative for color change and wound.  Neurological: Negative for syncope and numbness. other than noted Hematological: Negative for adenopathy. or other swelling Psychiatric/Behavioral: Negative for hallucinations, self-injury, decreased concentration or other worsening agitation.      Objective:   Physical Exam BP 92/62  Pulse 74  Temp(Src) 98 F (36.7 C) (Oral)  Ht 5\' 10"  (1.778 m)  Wt 199 lb 4 oz (90.379 kg)  BMI 28.59 kg/m2  SpO2 96% VS noted,  Constitutional: Pt is oriented to person, place, and time. Appears well-developed and well-nourished.  Head: Normocephalic and atraumatic.  Right Ear: External ear normal.  Left Ear: External ear normal.  Nose: Nose normal.  Mouth/Throat: Oropharynx is clear and moist.  Eyes: Conjunctivae and EOM are normal. Pupils are equal, round, and reactive to light.  Neck: Normal range of motion. Neck supple. No JVD present. No tracheal deviation present.  Cardiovascular: Normal rate, regular rhythm, normal heart sounds and intact distal pulses.   Pulmonary/Chest: Effort normal and breath sounds without rales or wheezing  Abdominal: Soft. Bowel sounds are normal. NT. No HSM  Musculoskeletal: Normal range of motion. Exhibits no edema.  Lymphadenopathy:  Has no cervical adenopathy.  Neurological: Pt is alert and oriented to person, place, and time. Pt has normal reflexes. No cranial nerve deficit. Motor grossly intact Skin: Skin is warm and dry. No rash noted.  Mild tender left trapezius noted Psychiatric:  Has normal mood and affect. Behavior is normal.     Assessment & Plan:

## 2013-11-11 NOTE — Progress Notes (Signed)
Pre visit review using our clinic review tool, if applicable. No additional management support is needed unless otherwise documented below in the visit note. 

## 2013-11-11 NOTE — Patient Instructions (Addendum)
Please continue all other medications as before, and refills have been done if requested.  Please have the pharmacy call with any other refills you may need.  Please continue your efforts at being more active, low cholesterol diet, and weight control.  You are otherwise up to date with prevention measures today.  Please keep your appointments with your specialists as you may have planned  You will be contacted regarding the referral for: colonoscopy  Please call if you change your mind about having the pneumonia  Shot (Prevnar)  Please go to the LAB in the Basement (turn left off the elevator) for the tests to be done today  You will be contacted by phone if any changes need to be made immediately.  Otherwise, you will receive a letter about your results with an explanation, but please check with MyChart first.  Please remember to sign up for MyChart if you have not done so, as this will be important to you in the future with finding out test results, communicating by private email, and scheduling acute appointments online when needed.  Please return in 1 year for your yearly visit, or sooner if needed

## 2013-12-19 ENCOUNTER — Encounter: Payer: Self-pay | Admitting: Internal Medicine

## 2013-12-24 DIAGNOSIS — L570 Actinic keratosis: Secondary | ICD-10-CM | POA: Diagnosis not present

## 2013-12-25 ENCOUNTER — Other Ambulatory Visit: Payer: Self-pay | Admitting: Cardiology

## 2013-12-26 ENCOUNTER — Other Ambulatory Visit: Payer: Self-pay

## 2013-12-26 MED ORDER — SIMVASTATIN 20 MG PO TABS: ORAL_TABLET | ORAL | Status: DC

## 2013-12-26 MED ORDER — LOSARTAN POTASSIUM 25 MG PO TABS: 25.0000 mg | ORAL_TABLET | Freq: Every day | ORAL | Status: DC

## 2013-12-26 MED ORDER — CARVEDILOL 25 MG PO TABS: ORAL_TABLET | ORAL | Status: DC

## 2014-01-13 DIAGNOSIS — H4010X0 Unspecified open-angle glaucoma, stage unspecified: Secondary | ICD-10-CM | POA: Diagnosis not present

## 2014-01-13 DIAGNOSIS — H18419 Arcus senilis, unspecified eye: Secondary | ICD-10-CM | POA: Diagnosis not present

## 2014-01-13 DIAGNOSIS — H409 Unspecified glaucoma: Secondary | ICD-10-CM | POA: Diagnosis not present

## 2014-01-13 DIAGNOSIS — Z961 Presence of intraocular lens: Secondary | ICD-10-CM | POA: Diagnosis not present

## 2014-02-11 DIAGNOSIS — Z23 Encounter for immunization: Secondary | ICD-10-CM | POA: Diagnosis not present

## 2014-03-03 ENCOUNTER — Other Ambulatory Visit: Payer: Self-pay | Admitting: *Deleted

## 2014-03-03 MED ORDER — CARVEDILOL 25 MG PO TABS: ORAL_TABLET | ORAL | Status: DC

## 2014-03-03 MED ORDER — LOSARTAN POTASSIUM 25 MG PO TABS: 25.0000 mg | ORAL_TABLET | Freq: Every day | ORAL | Status: DC

## 2014-03-03 MED ORDER — SIMVASTATIN 20 MG PO TABS: ORAL_TABLET | ORAL | Status: DC

## 2014-04-20 ENCOUNTER — Encounter: Payer: Self-pay | Admitting: Cardiology

## 2014-04-20 ENCOUNTER — Ambulatory Visit (INDEPENDENT_AMBULATORY_CARE_PROVIDER_SITE_OTHER): Payer: Medicare Other | Admitting: Cardiology

## 2014-04-20 VITALS — BP 110/70 | HR 83 | Ht 70.0 in | Wt 197.8 lb

## 2014-04-20 DIAGNOSIS — I2510 Atherosclerotic heart disease of native coronary artery without angina pectoris: Secondary | ICD-10-CM | POA: Diagnosis not present

## 2014-04-20 DIAGNOSIS — Z79899 Other long term (current) drug therapy: Secondary | ICD-10-CM | POA: Diagnosis not present

## 2014-04-20 DIAGNOSIS — E785 Hyperlipidemia, unspecified: Secondary | ICD-10-CM

## 2014-04-20 DIAGNOSIS — E089 Diabetes mellitus due to underlying condition without complications: Secondary | ICD-10-CM | POA: Diagnosis not present

## 2014-04-20 DIAGNOSIS — I10 Essential (primary) hypertension: Secondary | ICD-10-CM | POA: Diagnosis not present

## 2014-04-20 LAB — LIPID PANEL
CHOL/HDL RATIO: 3.5 ratio
Cholesterol: 130 mg/dL (ref 0–200)
HDL: 37 mg/dL — AB (ref >39)
LDL CALC: 74 mg/dL (ref 0–99)
Triglycerides: 97 mg/dL (ref <150)
VLDL: 19 mg/dL (ref 0–40)

## 2014-04-20 LAB — COMPLETE METABOLIC PANEL WITH GFR
ALK PHOS: 63 U/L (ref 39–117)
ALT: 21 U/L (ref 0–53)
AST: 22 U/L (ref 0–37)
Albumin: 4.2 g/dL (ref 3.5–5.2)
BILIRUBIN TOTAL: 0.8 mg/dL (ref 0.2–1.2)
BUN: 13 mg/dL (ref 6–23)
CO2: 21 mEq/L (ref 19–32)
Calcium: 9.5 mg/dL (ref 8.4–10.5)
Chloride: 106 mEq/L (ref 96–112)
Creat: 0.89 mg/dL (ref 0.50–1.35)
GFR, Est African American: >89 mL/min
GFR, Est Non African American: 83 mL/min
Glucose, Bld: 105 mg/dL — ABNORMAL HIGH (ref 70–99)
Potassium: 4.3 mEq/L (ref 3.5–5.3)
SODIUM: 138 meq/L (ref 135–145)
Total Protein: 7.1 g/dL (ref 6.0–8.3)

## 2014-04-20 LAB — HEMOGLOBIN A1C
Hgb A1c MFr Bld: 5.9 % — ABNORMAL HIGH (ref <5.7)
Mean Plasma Glucose: 123 mg/dL — ABNORMAL HIGH (ref <117)

## 2014-04-20 NOTE — Patient Instructions (Signed)
Your physician recommends that you schedule a follow-up appointment in: one year with Dr. Hochrein  We are ordering labs for you to get done  

## 2014-04-20 NOTE — Progress Notes (Signed)
HPI The patient presents for follow up of CAD.  In 2012 he had a  [employer].  His EF to be 48% which was about his previous. He had a large inferior infarct consistent with his previous right coronary occlusion. There was some apical anteroseptal ischemia with mild reversibility.  In the absence of symptoms we managed this medically.  He returns for yearly follow up.   He thinks his exercise tolerance is fine. He's not having any new shortness of breath, PND or orthopnea. He denies any chest pressure, neck or arm discomfort. He's had no palpitations, presyncope or syncope. He denies any weight gain or edema.   He doesn't walk as much as I would like.    No Known Allergies  Current Outpatient Prescriptions  Medication Sig Dispense Refill  . aspirin 81 MG tablet Take 81 mg by mouth daily.      . carvedilol (COREG) 25 MG tablet TAKE ONE TABLET BY MOUTH TWICE DAILY WITH MEALS 180 tablet 1  . FLUVIRIN SUSP   0  . losartan (COZAAR) 25 MG tablet Take 1 tablet (25 mg total) by mouth daily. 90 tablet 1  . NITROSTAT 0.4 MG SL tablet DISSOLVE ONE TABLET UNDER THE TONGUE EVERY 5 MINUTES AS NEEDED. 25 tablet 10  . simvastatin (ZOCOR) 20 MG tablet TAKE ONE TABLET BY MOUTH AT BEDTIME 90 tablet 1  . tamsulosin (FLOMAX) 0.4 MG CAPS capsule Take 0.4 mg by mouth daily after supper.    . travoprost, benzalkonium, (TRAVATAN) 0.004 % ophthalmic solution 1 drop at bedtime.       No current facility-administered medications for this visit.    Past Medical History  Diagnosis Date  . ALLERGIC RHINITIS   . ANXIETY   . BENIGN PROSTATIC HYPERTROPHY, HX OF   . CARDIOMYOPATHY     EF 45% 2012 echo.    . CARPAL TUNNEL SYNDROME, LEFT   . DEGENERATIVE JOINT DISEASE, KNEES, BILATERAL   . DEPRESSION   . FATIGUE   . GLAUCOMA   . GLUCOSE INTOLERANCE   . HYPERCHOLESTEROLEMIA   . HYPERLIPIDEMIA   . MYOCARDIAL INFARCTION, HX OF     LAD occlusion treated with stent x 2 ([facility] Study) 2006.  100% RCA and D2  managed medically.  40% circumflex stenosis.   . OSTEOARTHRITIS   . SINUSITIS- ACUTE-NOS   . URI     Past Surgical History  Procedure Laterality Date  . Turp vaporization  05/15/85    ROS:  As stated in the HPI and negative for all other systems.  PHYSICAL EXAM BP 110/70 mmHg  Pulse 83  Ht 5\' 10"  (1.778 m)  Wt 197 lb 12.8 oz (89.721 kg)  BMI 28.38 kg/m2 GENERAL:  Well appearing HEENT:  Pupils equal round and reactive, fundi not visualized, oral mucosa unremarkable NECK:  No jugular venous distention, waveform within normal limits, carotid upstroke brisk and symmetric, no bruits, no thyromegaly LUNGS:  Clear to auscultation bilaterally HEART:  PMI not displaced or sustained,S1 and S2 within normal limits, no S3, no S4, no clicks, no rubs, no murmurs ABD:  Flat, positive bowel sounds normal in frequency in pitch, no bruits, no rebound, no guarding, no midline pulsatile mass, no hepatomegaly, no splenomegaly EXT:  2 plus pulses throughout, no edema, no cyanosis no clubbing  EKG:     Normal sinus rhythm, rate 83, axis within normal limits, intervals within normal limits, old anteroseptal infarct.  This is unchanged from last year. 04/20/2014  ASSESSMENT AND PLAN  CORONARY ARTERY DISEASE -  The patient has no new sypmtoms.  No further cardiovascular testing is indicated.  We will continue with aggressive risk reduction and meds as listed.  HYPERTENSION -  The blood pressure is at target. No change in medications is indicated. We will continue with therapeutic lifestyle changes (TLC).  HYPERLIPIDEMIA -  I will check a lipid profile, liver enzymes and A1C  Lab Results  Component Value Date   CHOL 106 04/23/2013   TRIG 50.0 04/23/2013   HDL 43.30 04/23/2013   LDLCALC 53 04/23/2013   HYPERGLYCEMIA -  Has has had some glucose intolerance.  I will check an A1C.   Lab Results  Component Value Date   HGBA1C 5.4 04/23/2013

## 2014-07-20 DIAGNOSIS — R351 Nocturia: Secondary | ICD-10-CM | POA: Diagnosis not present

## 2014-07-20 DIAGNOSIS — N400 Enlarged prostate without lower urinary tract symptoms: Secondary | ICD-10-CM | POA: Diagnosis not present

## 2014-07-23 DIAGNOSIS — L570 Actinic keratosis: Secondary | ICD-10-CM | POA: Diagnosis not present

## 2014-07-23 DIAGNOSIS — L821 Other seborrheic keratosis: Secondary | ICD-10-CM | POA: Diagnosis not present

## 2014-07-23 DIAGNOSIS — Z85828 Personal history of other malignant neoplasm of skin: Secondary | ICD-10-CM | POA: Diagnosis not present

## 2014-08-07 ENCOUNTER — Other Ambulatory Visit: Payer: Self-pay | Admitting: Cardiology

## 2014-08-31 DIAGNOSIS — H409 Unspecified glaucoma: Secondary | ICD-10-CM | POA: Diagnosis not present

## 2014-08-31 DIAGNOSIS — Z961 Presence of intraocular lens: Secondary | ICD-10-CM | POA: Diagnosis not present

## 2014-10-23 ENCOUNTER — Telehealth: Payer: Self-pay | Admitting: Cardiology

## 2014-10-23 NOTE — Telephone Encounter (Signed)
Pt states taking 1/2 tab BID (12.5mg ) of carvedilol daily, historic dose, has never been changed but he noted Rx on bottle states 25mg  BID.  He denies any problems taking this dose, HR WNR. Does not need refill as extra pills being sent by pharmacy.  Advised to keep on the dose he is on, continue to check his VS regularly, advise of any concerns. Updated med list to reflect pt reported dose.

## 2014-10-23 NOTE — Telephone Encounter (Signed)
Re-open by mistake

## 2014-10-23 NOTE — Telephone Encounter (Signed)
Please call,question about how he is suppose to be taking his Carvedilol.

## 2014-11-18 ENCOUNTER — Encounter: Payer: Medicare Other | Admitting: Internal Medicine

## 2015-01-26 DIAGNOSIS — L570 Actinic keratosis: Secondary | ICD-10-CM | POA: Diagnosis not present

## 2015-01-26 DIAGNOSIS — L821 Other seborrheic keratosis: Secondary | ICD-10-CM | POA: Diagnosis not present

## 2015-01-27 DIAGNOSIS — Z23 Encounter for immunization: Secondary | ICD-10-CM | POA: Diagnosis not present

## 2015-03-02 ENCOUNTER — Encounter: Payer: Self-pay | Admitting: *Deleted

## 2015-03-10 ENCOUNTER — Other Ambulatory Visit: Payer: Self-pay | Admitting: Cardiology

## 2015-03-10 NOTE — Telephone Encounter (Signed)
New message      STAT if patient is at the pharmacy , call can be transferred to refill team.   1. Which medications need to be refilled? Carvedilol, losartan, simvastatin 2. Which pharmacy/location is medication to be sent to? [facility]----fax [phone]  3. Do they need a 30 day or 90 day supply? 90day supply

## 2015-03-15 DIAGNOSIS — H409 Unspecified glaucoma: Secondary | ICD-10-CM | POA: Diagnosis not present

## 2015-03-15 DIAGNOSIS — Z961 Presence of intraocular lens: Secondary | ICD-10-CM | POA: Diagnosis not present

## 2015-03-15 DIAGNOSIS — H18413 Arcus senilis, bilateral: Secondary | ICD-10-CM | POA: Diagnosis not present

## 2015-03-15 DIAGNOSIS — H2511 Age-related nuclear cataract, right eye: Secondary | ICD-10-CM | POA: Diagnosis not present

## 2015-04-20 ENCOUNTER — Ambulatory Visit (INDEPENDENT_AMBULATORY_CARE_PROVIDER_SITE_OTHER): Payer: Medicare Other | Admitting: Cardiology

## 2015-04-20 ENCOUNTER — Encounter: Payer: Self-pay | Admitting: Cardiology

## 2015-04-20 VITALS — BP 102/66 | HR 72 | Ht 70.0 in | Wt 199.6 lb

## 2015-04-20 DIAGNOSIS — Z79899 Other long term (current) drug therapy: Secondary | ICD-10-CM

## 2015-04-20 DIAGNOSIS — I2510 Atherosclerotic heart disease of native coronary artery without angina pectoris: Secondary | ICD-10-CM

## 2015-04-20 DIAGNOSIS — E785 Hyperlipidemia, unspecified: Secondary | ICD-10-CM | POA: Diagnosis not present

## 2015-04-20 NOTE — Patient Instructions (Signed)
Your physician recommends that you return for lab work in: [place] physician recommends that you schedule a follow-up appointment in: [place]

## 2015-04-20 NOTE — Progress Notes (Signed)
HPI The patient presents for follow up of CAD.  In 2012 he had a  [employer].  His EF to be 48% which was about his previous. He had a large inferior infarct consistent with his previous right coronary occlusion. There was some apical anteroseptal ischemia with mild reversibility.  In the absence of symptoms we managed this medically.    No Known Allergies  Current Outpatient Prescriptions  Medication Sig Dispense Refill  . aspirin 81 MG tablet Take 81 mg by mouth daily.      . carvedilol (COREG) 25 MG tablet Take 1 tablet (25 mg total) by mouth 2 (two) times daily with a meal. (Patient taking differently: Take 12.5 mg by mouth 2 (two) times daily with a meal. ) 180 tablet 0  . FLUVIRIN SUSP   0  . losartan (COZAAR) 25 MG tablet Take 1 tablet (25 mg total) by mouth daily. 90 tablet 0  . NITROSTAT 0.4 MG SL tablet DISSOLVE ONE TABLET UNDER THE TONGUE EVERY 5 MINUTES AS NEEDED. 25 tablet 10  . simvastatin (ZOCOR) 20 MG tablet Take 1 tablet (20 mg total) by mouth at bedtime. 90 tablet 0  . tamsulosin (FLOMAX) 0.4 MG CAPS capsule Take 0.4 mg by mouth daily after supper.    . travoprost, benzalkonium, (TRAVATAN) 0.004 % ophthalmic solution 1 drop at bedtime.       No current facility-administered medications for this visit.    Past Medical History  Diagnosis Date  . ALLERGIC RHINITIS   . ANXIETY   . BENIGN PROSTATIC HYPERTROPHY, HX OF   . CARDIOMYOPATHY     EF 45% 2012 echo.    . CARPAL TUNNEL SYNDROME, LEFT   . DEGENERATIVE JOINT DISEASE, KNEES, BILATERAL   . DEPRESSION   . FATIGUE   . GLAUCOMA   . GLUCOSE INTOLERANCE   . HYPERCHOLESTEROLEMIA   . HYPERLIPIDEMIA   . MYOCARDIAL INFARCTION, HX OF     LAD occlusion treated with stent x 2 (HORIZON Study) 2006.  100% RCA and D2 managed medically.  40% circumflex stenosis.   . OSTEOARTHRITIS   . SINUSITIS- ACUTE-NOS   . URI     Past Surgical History  Procedure Laterality Date  . Turp vaporization  05/15/85    ROS:  As  stated in the HPI and negative for all other systems.  PHYSICAL EXAM BP 102/66 mmHg  Pulse 72  Ht 5\' 10"  (1.778 m)  Wt 199 lb 9 oz (90.521 kg)  BMI 28.63 kg/m2 GENERAL:  Well appearing HEENT:  Pupils equal round and reactive, fundi not visualized, oral mucosa unremarkable NECK:  No jugular venous distention, waveform within normal limits, carotid upstroke brisk and symmetric, no bruits, no thyromegaly LUNGS:  Clear to auscultation bilaterally HEART:  PMI not displaced or sustained,S1 and S2 within normal limits, no S3, no S4, no clicks, no rubs, no murmurs ABD:  Flat, positive bowel sounds normal in frequency in pitch, no bruits, no rebound, no guarding, no midline pulsatile mass, no hepatomegaly, no splenomegaly EXT:  2 plus pulses throughout, no edema, no cyanosis no clubbing  EKG:     Normal sinus rhythm, rate 72, axis within normal limits, intervals within normal limits, old anteroseptal infarct.  This is unchanged from last year. 04/20/2015   ASSESSMENT AND PLAN  CORONARY ARTERY DISEASE -  The patient has no new sypmtoms.  No further cardiovascular testing is indicated.  We will continue with aggressive risk reduction and meds as listed.  (Of  note he takes 1/2 of the above dose of beta blocker.)  HYPERTENSION -  The blood pressure is at target. No change in medications is indicated. We will continue with therapeutic lifestyle changes (TLC).  HYPERLIPIDEMIA -  I will check a fasting lipid profile and liver enzymes.   Lab Results  Component Value Date   CHOL 130 04/20/2014   TRIG 97 04/20/2014   HDL 37* 04/20/2014   LDLCALC 74 04/20/2014   HYPERGLYCEMIA -  Has has had some glucose intolerance.  No change in therapy is indicated.  He needs continued diet control.   Lab Results  Component Value Date   HGBA1C 5.9* 04/20/2014

## 2015-04-27 DIAGNOSIS — E785 Hyperlipidemia, unspecified: Secondary | ICD-10-CM | POA: Diagnosis not present

## 2015-04-27 DIAGNOSIS — Z79899 Other long term (current) drug therapy: Secondary | ICD-10-CM | POA: Diagnosis not present

## 2015-04-28 LAB — LIPID PANEL
CHOLESTEROL: 128 mg/dL (ref 125–200)
HDL: 35 mg/dL — AB (ref >=40)
LDL CALC: 75 mg/dL (ref <130)
TRIGLYCERIDES: 89 mg/dL (ref <150)
Total CHOL/HDL Ratio: 3.7 Ratio (ref <=5.0)
VLDL: 18 mg/dL (ref <30)

## 2015-04-28 LAB — GLUCOSE, RANDOM: Glucose, Bld: 100 mg/dL — ABNORMAL HIGH (ref 65–99)

## 2015-04-28 LAB — HEPATIC FUNCTION PANEL
ALK PHOS: 62 U/L (ref 40–115)
ALT: 18 U/L (ref 9–46)
AST: 18 U/L (ref 10–35)
Albumin: 3.8 g/dL (ref 3.6–5.1)
BILIRUBIN DIRECT: 0.1 mg/dL (ref <=0.2)
BILIRUBIN INDIRECT: 0.5 mg/dL (ref 0.2–1.2)
TOTAL PROTEIN: 7 g/dL (ref 6.1–8.1)
Total Bilirubin: 0.6 mg/dL (ref 0.2–1.2)

## 2015-05-11 ENCOUNTER — Other Ambulatory Visit: Payer: Self-pay | Admitting: Cardiology

## 2015-05-11 NOTE — Telephone Encounter (Signed)
Rx request sent to pharmacy.  

## 2015-07-21 DIAGNOSIS — N401 Enlarged prostate with lower urinary tract symptoms: Secondary | ICD-10-CM | POA: Diagnosis not present

## 2015-07-21 DIAGNOSIS — Z0000 Encounter for general adult medical examination without abnormal findings: Secondary | ICD-10-CM | POA: Diagnosis not present

## 2015-07-21 DIAGNOSIS — R351 Nocturia: Secondary | ICD-10-CM | POA: Diagnosis not present

## 2015-08-11 DIAGNOSIS — L570 Actinic keratosis: Secondary | ICD-10-CM | POA: Diagnosis not present

## 2015-08-11 DIAGNOSIS — Z23 Encounter for immunization: Secondary | ICD-10-CM | POA: Diagnosis not present

## 2015-08-11 DIAGNOSIS — Z85828 Personal history of other malignant neoplasm of skin: Secondary | ICD-10-CM | POA: Diagnosis not present

## 2015-08-18 ENCOUNTER — Ambulatory Visit (INDEPENDENT_AMBULATORY_CARE_PROVIDER_SITE_OTHER): Payer: Medicare Other | Admitting: Internal Medicine

## 2015-08-18 ENCOUNTER — Encounter: Payer: Self-pay | Admitting: Internal Medicine

## 2015-08-18 VITALS — BP 122/76 | HR 95 | Temp 98.7°F | Resp 20 | Wt 197.0 lb

## 2015-08-18 DIAGNOSIS — R05 Cough: Secondary | ICD-10-CM | POA: Diagnosis not present

## 2015-08-18 DIAGNOSIS — R7302 Impaired glucose tolerance (oral): Secondary | ICD-10-CM | POA: Diagnosis not present

## 2015-08-18 DIAGNOSIS — I10 Essential (primary) hypertension: Secondary | ICD-10-CM

## 2015-08-18 DIAGNOSIS — R059 Cough, unspecified: Secondary | ICD-10-CM | POA: Insufficient documentation

## 2015-08-18 MED ORDER — LEVOFLOXACIN 250 MG PO TABS: 250.0000 mg | ORAL_TABLET | Freq: Every day | ORAL | Status: DC

## 2015-08-18 MED ORDER — HYDROCODONE-HOMATROPINE 5-1.5 MG/5ML PO SYRP: 5.0000 mL | ORAL_SOLUTION | Freq: Four times a day (QID) | ORAL | Status: DC | PRN

## 2015-08-18 NOTE — Progress Notes (Signed)
Pre visit review using our clinic review tool, if applicable. No additional management support is needed unless otherwise documented below in the visit note. 

## 2015-08-18 NOTE — Progress Notes (Signed)
Subjective:    Patient ID: Juan Mcdaniel, male    DOB: [birth year], 80 y.o.   MRN: [id number]  HPI  Here with acute onset mild to mod 2-3 days ST, HA, general weakness and malaise, with prod cough greenish sputum, but Pt denies chest pain, increased sob or doe, wheezing, orthopnea, PND, increased LE swelling, palpitations, dizziness or syncope. Pt denies new neurological symptoms such as new headache, or facial or extremity weakness or numbness  . Pt denies polydipsia, polyuria, Past Medical History  Diagnosis Date  . ALLERGIC RHINITIS   . ANXIETY   . BENIGN PROSTATIC HYPERTROPHY, HX OF   . CARDIOMYOPATHY     EF 45% 2012 echo.    . CARPAL TUNNEL SYNDROME, LEFT   . DEGENERATIVE JOINT DISEASE, KNEES, BILATERAL   . DEPRESSION   . FATIGUE   . GLAUCOMA   . GLUCOSE INTOLERANCE   . HYPERCHOLESTEROLEMIA   . HYPERLIPIDEMIA   . MYOCARDIAL INFARCTION, HX OF     LAD occlusion treated with stent x 2 (HORIZON Study) 2006.  100% RCA and D2 managed medically.  40% circumflex stenosis.   . OSTEOARTHRITIS   . SINUSITIS- ACUTE-NOS   . URI    Past Surgical History  Procedure Laterality Date  . Turp vaporization  05/15/85    reports that he has quit smoking. He does not have any smokeless tobacco history on file. He reports that he drinks alcohol. He reports that he does not use illicit drugs. family history is not on file. No Known Allergies Current Outpatient Prescriptions on File Prior to Visit  Medication Sig Dispense Refill  . aspirin 81 MG tablet Take 81 mg by mouth daily.      . carvedilol (COREG) 25 MG tablet TAKE 1 TABLET TWICE DAILY WITH A MEAL 180 tablet 3  . FLUVIRIN SUSP   0  . losartan (COZAAR) 25 MG tablet TAKE 1 TABLET EVERY DAY 90 tablet 3  . NITROSTAT 0.4 MG SL tablet DISSOLVE ONE TABLET UNDER THE TONGUE EVERY 5 MINUTES AS NEEDED. 25 tablet 10  . simvastatin (ZOCOR) 20 MG tablet TAKE 1 TABLET AT BEDTIME 90 tablet 3  . tamsulosin (FLOMAX) 0.4 MG CAPS capsule Take 0.4 mg by  mouth daily after supper.    . travoprost, benzalkonium, (TRAVATAN) 0.004 % ophthalmic solution 1 drop at bedtime.       No current facility-administered medications on file prior to visit.   .   Review of Systems  Constitutional: Negative for unusual diaphoresis or night sweats HENT: Negative for ear swelling or discharge Eyes: Negative for worsening visual haziness  Respiratory: Negative for choking and stridor.   Gastrointestinal: Negative for distension or worsening eructation Genitourinary: Negative for retention or change in urine volume.  Musculoskeletal: Negative for other MSK pain or swelling Skin: Negative for color change and worsening wound Neurological: Negative for tremors and numbness other than noted  Psychiatric/Behavioral: Negative for decreased concentration or agitation other than above       Objective:   Physical Exam BP 122/76 mmHg  Pulse 95  Temp(Src) 98.7 F (37.1 C) (Oral)  Resp 20  Wt 197 lb (89.359 kg)  SpO2 95% VS noted, mild ill Constitutional: Pt appears in no apparent distress HENT: Head: NCAT.  Right Ear: External ear normal.  Left Ear: External ear normal.  Bilat tm's with mild erythema.  Max sinus areas mild tender.  Pharynx with mild erythema, no exudate Eyes: . Pupils are equal, round, and reactive to light.  Conjunctivae and EOM are normal Neck: Normal range of motion. Neck supple.  Cardiovascular: Normal rate and regular rhythm.   Pulmonary/Chest: Effort normal and breath sounds without rales or wheezing. except has bibas crackles left > right, small isolated wheeze left base Abd:  Soft, NT, ND, + BS Neurological: Pt is alert. Not confused , motor grossly intact Skin: Skin is warm. No rash, no LE edema Psychiatric: Pt behavior is normal. No agitation.     Assessment & Plan:

## 2015-08-18 NOTE — Patient Instructions (Signed)
You had the antibiotic shot today (rocephin, which is related to penicillin)  Please take all new medication as prescribed - the pill antibiotic, and cough medicine  Please call if you seem to get worse, as you may need a Chest Xray  Please continue all other medications as before, and refills have been done if requested.  Please have the pharmacy call with any other refills you may need.  Please keep your appointments with your specialists as you may have planned

## 2015-08-19 NOTE — Assessment & Plan Note (Signed)
stable overall by history and exam, recent data reviewed with pt, and pt to continue medical treatment as before,  to f/u any worsening symptoms or concerns BP Readings from Last 3 Encounters:  08/18/15 122/76  04/20/15 102/66  04/20/14 110/70

## 2015-08-19 NOTE — Assessment & Plan Note (Signed)
Mild to mod, c/w bornchitis vs pna, declines cxr despite abnormal exam as has no sob or wheezing, for rocephin IM and oral antibx course,  Cough med prn, to f/u any worsening symptoms or concerns

## 2015-08-19 NOTE — Assessment & Plan Note (Signed)
stable overall by history and exam, recent data reviewed with pt, and pt to continue medical treatment as before,  to f/u any worsening symptoms or concerns Lab Results  Component Value Date   HGBA1C 5.9* 04/20/2014   Pt to call for onset polys or cbg > 200 with illness

## 2015-09-27 DIAGNOSIS — H409 Unspecified glaucoma: Secondary | ICD-10-CM | POA: Diagnosis not present

## 2015-09-27 DIAGNOSIS — Z961 Presence of intraocular lens: Secondary | ICD-10-CM | POA: Diagnosis not present

## 2015-09-27 DIAGNOSIS — H18413 Arcus senilis, bilateral: Secondary | ICD-10-CM | POA: Diagnosis not present

## 2015-09-27 DIAGNOSIS — H2511 Age-related nuclear cataract, right eye: Secondary | ICD-10-CM | POA: Diagnosis not present

## 2015-11-10 DIAGNOSIS — D0462 Carcinoma in situ of skin of left upper limb, including shoulder: Secondary | ICD-10-CM | POA: Diagnosis not present

## 2015-11-10 DIAGNOSIS — L570 Actinic keratosis: Secondary | ICD-10-CM | POA: Diagnosis not present

## 2015-11-10 DIAGNOSIS — D0439 Carcinoma in situ of skin of other parts of face: Secondary | ICD-10-CM | POA: Diagnosis not present

## 2015-11-10 DIAGNOSIS — D485 Neoplasm of uncertain behavior of skin: Secondary | ICD-10-CM | POA: Diagnosis not present

## 2015-11-10 DIAGNOSIS — D239 Other benign neoplasm of skin, unspecified: Secondary | ICD-10-CM | POA: Diagnosis not present

## 2015-11-17 DIAGNOSIS — L738 Other specified follicular disorders: Secondary | ICD-10-CM | POA: Diagnosis not present

## 2015-11-17 DIAGNOSIS — D485 Neoplasm of uncertain behavior of skin: Secondary | ICD-10-CM | POA: Diagnosis not present

## 2015-11-17 DIAGNOSIS — L570 Actinic keratosis: Secondary | ICD-10-CM | POA: Diagnosis not present

## 2015-11-30 DIAGNOSIS — H2512 Age-related nuclear cataract, left eye: Secondary | ICD-10-CM | POA: Diagnosis not present

## 2015-11-30 DIAGNOSIS — H40159 Residual stage of open-angle glaucoma, unspecified eye: Secondary | ICD-10-CM | POA: Diagnosis not present

## 2015-11-30 DIAGNOSIS — H524 Presbyopia: Secondary | ICD-10-CM | POA: Diagnosis not present

## 2015-12-15 ENCOUNTER — Telehealth: Payer: Self-pay | Admitting: Emergency Medicine

## 2015-12-15 NOTE — Telephone Encounter (Signed)
Pt wants to know if he can get a chest xray done so he can have it done before his next appointment. He is having a lot of congestion. Please follow up thanks.

## 2015-12-16 ENCOUNTER — Telehealth: Payer: Self-pay

## 2015-12-16 DIAGNOSIS — Z029 Encounter for administrative examinations, unspecified: Secondary | ICD-10-CM | POA: Diagnosis not present

## 2015-12-16 NOTE — Telephone Encounter (Signed)
Pt is down stairs requesting a chest xray, he has an appt with you on 8/09. Can you please put in the order, pt is waiting. Thanks.

## 2015-12-16 NOTE — Telephone Encounter (Signed)
Patient advised per dr Jenny Reichmann, he will need to make determination of xray neccesity at office visit---do not come for xray until after dr Jenny Reichmann sees in office visit, patient repeated back for understanding

## 2015-12-16 NOTE — Telephone Encounter (Signed)
error 

## 2015-12-16 NOTE — Telephone Encounter (Signed)
Patient came into xray, I have walked down to xray to explain to patient, that per dr Jenny Reichmann, no xray is needed at this time, dr Jenny Reichmann wants to see patient first in office visit and then determine if he needs to go down for xray---patient is hard of hearing, so we talked in person down at xray lab, patient repeated back for understanding

## 2015-12-16 NOTE — Telephone Encounter (Signed)
All imaging needs to be interpreted in conjunction with history and physical exam  Please consider OV

## 2015-12-17 DIAGNOSIS — Z029 Encounter for administrative examinations, unspecified: Secondary | ICD-10-CM | POA: Diagnosis not present

## 2015-12-17 NOTE — Telephone Encounter (Signed)
Error

## 2015-12-22 ENCOUNTER — Ambulatory Visit (INDEPENDENT_AMBULATORY_CARE_PROVIDER_SITE_OTHER): Payer: Medicare Other | Admitting: Internal Medicine

## 2015-12-22 ENCOUNTER — Ambulatory Visit (INDEPENDENT_AMBULATORY_CARE_PROVIDER_SITE_OTHER)
Admission: RE | Admit: 2015-12-22 | Discharge: 2015-12-22 | Disposition: A | Discharge status: Home / Self Care | Payer: Medicare Other | Source: Ambulatory Visit | Attending: Internal Medicine | Admitting: Internal Medicine

## 2015-12-22 VITALS — BP 116/72 | HR 75 | Temp 97.7°F | Resp 16 | Ht 70.0 in | Wt 192.0 lb

## 2015-12-22 DIAGNOSIS — Z23 Encounter for immunization: Secondary | ICD-10-CM | POA: Diagnosis not present

## 2015-12-22 DIAGNOSIS — R062 Wheezing: Secondary | ICD-10-CM | POA: Diagnosis not present

## 2015-12-22 DIAGNOSIS — E785 Hyperlipidemia, unspecified: Secondary | ICD-10-CM

## 2015-12-22 DIAGNOSIS — R7302 Impaired glucose tolerance (oral): Secondary | ICD-10-CM

## 2015-12-22 DIAGNOSIS — I10 Essential (primary) hypertension: Secondary | ICD-10-CM | POA: Diagnosis not present

## 2015-12-22 DIAGNOSIS — J309 Allergic rhinitis, unspecified: Secondary | ICD-10-CM | POA: Diagnosis not present

## 2015-12-22 DIAGNOSIS — R05 Cough: Secondary | ICD-10-CM | POA: Diagnosis not present

## 2015-12-22 MED ORDER — ALBUTEROL SULFATE HFA 108 (90 BASE) MCG/ACT IN AERS: 2.0000 | INHALATION_SPRAY | Freq: Four times a day (QID) | RESPIRATORY_TRACT | 3 refills | Status: DC | PRN

## 2015-12-22 MED ORDER — METHYLPREDNISOLONE ACETATE 80 MG/ML IJ SUSP
80.0000 mg | Freq: Once | INTRAMUSCULAR | Status: AC
  Administered 2015-12-22: 80 mg via INTRAMUSCULAR

## 2015-12-22 MED ORDER — BUDESONIDE-FORMOTEROL FUMARATE 160-4.5 MCG/ACT IN AERO
2.0000 | INHALATION_SPRAY | Freq: Two times a day (BID) | RESPIRATORY_TRACT | 3 refills | Status: DC
Start: 2015-12-22 — End: 2015-12-22

## 2015-12-22 MED ORDER — BUDESONIDE-FORMOTEROL FUMARATE 160-4.5 MCG/ACT IN AERO: 2.0000 | INHALATION_SPRAY | Freq: Two times a day (BID) | RESPIRATORY_TRACT | 3 refills | Status: DC

## 2015-12-22 MED ORDER — ALBUTEROL SULFATE HFA 108 (90 BASE) MCG/ACT IN AERS: 2.0000 | INHALATION_SPRAY | Freq: Four times a day (QID) | RESPIRATORY_TRACT | 0 refills | Status: DC | PRN

## 2015-12-22 NOTE — Progress Notes (Signed)
Subjective:    Patient ID: Juan Mcdaniel, male    DOB: [birth year], 80 y.o.   MRN: [id number]  HPI   Here to f/u, Does have several wks ongoing nasal allergy symptoms with clearish congestion, itch and sneezing, without fever, pain, ST, but has worsening non prod cough and mild wheezing, sob. I have "Clear congestion in my lungs" worse at night, better during the day.   Pt denies fever, wt loss, night sweats, loss of appetite, or other constitutional symptoms Pt denies chest pain, orthopnea, PND, increased LE swelling, palpitations, dizziness or syncope. Past Medical History:  Diagnosis Date  . ALLERGIC RHINITIS   . ANXIETY   . BENIGN PROSTATIC HYPERTROPHY, HX OF   . CARDIOMYOPATHY    EF 45% 2012 echo.    . CARPAL TUNNEL SYNDROME, LEFT   . DEGENERATIVE JOINT DISEASE, KNEES, BILATERAL   . DEPRESSION   . FATIGUE   . GLAUCOMA   . GLUCOSE INTOLERANCE   . HYPERCHOLESTEROLEMIA   . HYPERLIPIDEMIA   . MYOCARDIAL INFARCTION, HX OF    LAD occlusion treated with stent x 2 (HORIZON Study) 2006.  100% RCA and D2 managed medically.  40% circumflex stenosis.   . OSTEOARTHRITIS   . SINUSITIS- ACUTE-NOS   . URI    Past Surgical History:  Procedure Laterality Date  . TURP VAPORIZATION  05/15/85    reports that he has quit smoking. He does not have any smokeless tobacco history on file. He reports that he drinks alcohol. He reports that he does not use drugs. family history is not on file. No Known Allergies Current Outpatient Prescriptions on File Prior to Visit  Medication Sig Dispense Refill  . aspirin 81 MG tablet Take 81 mg by mouth daily.      . carvedilol (COREG) 25 MG tablet TAKE 1 TABLET TWICE DAILY WITH A MEAL 180 tablet 3  . FLUVIRIN SUSP   0  . HYDROcodone-homatropine (HYCODAN) 5-1.5 MG/5ML syrup Take 5 mLs by mouth every 6 (six) hours as needed for cough. 180 mL 0  . levofloxacin (LEVAQUIN) 250 MG tablet Take 1 tablet (250 mg total) by mouth daily. 10 tablet 0  . losartan  (COZAAR) 25 MG tablet TAKE 1 TABLET EVERY DAY 90 tablet 3  . NITROSTAT 0.4 MG SL tablet DISSOLVE ONE TABLET UNDER THE TONGUE EVERY 5 MINUTES AS NEEDED. 25 tablet 10  . simvastatin (ZOCOR) 20 MG tablet TAKE 1 TABLET AT BEDTIME 90 tablet 3  . tamsulosin (FLOMAX) 0.4 MG CAPS capsule Take 0.4 mg by mouth daily after supper.    . travoprost, benzalkonium, (TRAVATAN) 0.004 % ophthalmic solution 1 drop at bedtime.       No current facility-administered medications on file prior to visit.     Review of Systems  Constitutional: Negative for unusual diaphoresis or night sweats HENT: Negative for ear swelling or discharge Eyes: Negative for worsening visual haziness  Respiratory: Negative for choking and stridor.   Gastrointestinal: Negative for distension or worsening eructation Genitourinary: Negative for retention or change in urine volume.  Musculoskeletal: Negative for other MSK pain or swelling Skin: Negative for color change and worsening wound Neurological: Negative for tremors and numbness other than noted  Psychiatric/Behavioral: Negative for decreased concentration or agitation other than above       Objective:   Physical Exam BP 116/72   Pulse 75   Temp 97.7 F (36.5 C) (Oral)   Resp 16   Ht 5\' 10"  (1.778 m)   Wt  192 lb (87.1 kg)   SpO2 95%   BMI 27.55 kg/m   VS noted, non toxic, not ill appearing Constitutional: Pt appears in no apparent distress HENT: Head: NCAT.  Right Ear: External ear normal.  Left Ear: External ear normal Bilat tm's with mild erythema.  Max sinus areas non tender.  Pharynx with mild erythema, no exudate Eyes: . Pupils are equal, round, and reactive to light. Conjunctivae and EOM are normal Neck: Normal range of motion. Neck supple.  Cardiovascular: Normal rate and regular rhythm.   Pulmonary/Chest: Effort normal and breath sounds decreased without rales but with few scattered wheezing.  Neurological: Pt is alert. Not confused , motor grossly  intact Skin: Skin is warm. No rash, no LE edema Psychiatric: Pt behavior is normal. No agitation.       Assessment & Plan:

## 2015-12-22 NOTE — Progress Notes (Signed)
Pre visit review using our clinic review tool, if applicable. No additional management support is needed unless otherwise documented below in the visit note. 

## 2015-12-22 NOTE — Assessment & Plan Note (Addendum)
C/w asthma vs copd exac - for depomedrol IM, albut MDI prn, and symbicort asd, declines pft for now, for cxr though today  Note:  Total time for pt hx, exam, review of record with pt in the room, determination of diagnoses and plan for further eval and tx is > 40 min, with over 50% spent in coordination and counseling of patient

## 2015-12-22 NOTE — Patient Instructions (Addendum)
You had the Prevnar pneumonia shot today   You had the steroid shot today  You can take OTC Zyrtec 10 mg per day, and Nasacort AQ daily for the nasal allergies  Please take all new medication as prescribed   - the symbicort inhaler, as well as the albuterol if needed as well for shortness of breath  Please continue all other medications as before, and refills have been done if requested.  Please have the pharmacy call with any other refills you may need.  Please keep your appointments with your specialists as you may have planned  Please go to the XRAY Department in the Basement (go straight as you get off the elevator) for the x-ray testing  You will be contacted by phone if any changes need to be made immediately.  Otherwise, you will receive a letter about your results with an explanation, but please check with MyChart first.  Please remember to sign up for MyChart if you have not done so, as this will be important to you in the future with finding out test results, communicating by private email, and scheduling acute appointments online when needed.  Please return in 6 months, or sooner if needed

## 2015-12-23 ENCOUNTER — Encounter: Payer: Self-pay | Admitting: Internal Medicine

## 2015-12-23 ENCOUNTER — Other Ambulatory Visit: Payer: Self-pay | Admitting: Internal Medicine

## 2015-12-23 DIAGNOSIS — R9389 Abnormal findings on diagnostic imaging of other specified body structures: Secondary | ICD-10-CM

## 2015-12-24 ENCOUNTER — Other Ambulatory Visit: Payer: Self-pay | Admitting: Internal Medicine

## 2015-12-24 ENCOUNTER — Encounter: Payer: Self-pay | Admitting: Internal Medicine

## 2015-12-24 ENCOUNTER — Ambulatory Visit (INDEPENDENT_AMBULATORY_CARE_PROVIDER_SITE_OTHER)
Admission: RE | Admit: 2015-12-24 | Discharge: 2015-12-24 | Disposition: A | Discharge status: Home / Self Care | Payer: Medicare Other | Source: Ambulatory Visit | Attending: Internal Medicine | Admitting: Internal Medicine

## 2015-12-24 DIAGNOSIS — R9389 Abnormal findings on diagnostic imaging of other specified body structures: Secondary | ICD-10-CM

## 2015-12-24 DIAGNOSIS — R938 Abnormal findings on diagnostic imaging of other specified body structures: Secondary | ICD-10-CM

## 2015-12-24 DIAGNOSIS — J479 Bronchiectasis, uncomplicated: Secondary | ICD-10-CM

## 2015-12-28 NOTE — Assessment & Plan Note (Signed)
stable overall by history and exam, recent data reviewed with pt, and pt to continue medical treatment as before,  to f/u any worsening symptoms or concerns BP Readings from Last 3 Encounters:  12/22/15 116/72  08/18/15 122/76  04/20/15 102/66

## 2015-12-28 NOTE — Assessment & Plan Note (Signed)
stable overall by history and exam, recent data reviewed with pt, and pt to continue medical treatment as before,  to f/u any worsening symptoms or concerns Lab Results  Component Value Date   [place] 77 04/27/2015   For f/u lab

## 2015-12-28 NOTE — Assessment & Plan Note (Signed)
stable overall by history and exam, recent data reviewed with pt, and pt to continue medical treatment as before,  to f/u any worsening symptoms or concerns Lab Results  Component Value Date   HGBA1C 5.9 (H) 04/20/2014

## 2015-12-28 NOTE — Assessment & Plan Note (Signed)
Mild to mod, for depomedrol IM, zyrtec, allegra,  to f/u any worsening symptoms or concerns

## 2015-12-30 DIAGNOSIS — Z029 Encounter for administrative examinations, unspecified: Secondary | ICD-10-CM | POA: Diagnosis not present

## 2016-01-25 ENCOUNTER — Encounter: Payer: Self-pay | Admitting: Pulmonary Disease

## 2016-01-25 ENCOUNTER — Ambulatory Visit (INDEPENDENT_AMBULATORY_CARE_PROVIDER_SITE_OTHER): Payer: Medicare Other | Admitting: Pulmonary Disease

## 2016-01-25 ENCOUNTER — Other Ambulatory Visit (INDEPENDENT_AMBULATORY_CARE_PROVIDER_SITE_OTHER): Payer: Medicare Other

## 2016-01-25 ENCOUNTER — Telehealth: Payer: Self-pay | Admitting: Pulmonary Disease

## 2016-01-25 DIAGNOSIS — J849 Interstitial pulmonary disease, unspecified: Secondary | ICD-10-CM | POA: Insufficient documentation

## 2016-01-25 DIAGNOSIS — J479 Bronchiectasis, uncomplicated: Secondary | ICD-10-CM | POA: Diagnosis not present

## 2016-01-25 DIAGNOSIS — Z23 Encounter for immunization: Secondary | ICD-10-CM | POA: Diagnosis not present

## 2016-01-25 LAB — SEDIMENTATION RATE: Sed Rate: 29 mm/hr — ABNORMAL HIGH (ref 0–20)

## 2016-01-25 LAB — C-REACTIVE PROTEIN: CRP: 0.1 mg/dL — AB (ref 0.5–20.0)

## 2016-01-25 MED ORDER — ACAPELLA MISC: 0 refills | Status: DC

## 2016-01-25 NOTE — Patient Instructions (Addendum)
   You can continue to use your Robitussin if you feel it helps your cough.  You can try using Guaifenesin/Mucinex twice daily with plenty of water to help your cough.  Hold off on using your inhaler medications for now until we get your breathing tests done.  The Acapella/Flutter Valve should be used by blowing into it hard 3 times in a row twice daily. If this doesn't help you get up your mucus let me know and we'll try something else.  We will review your breathing & walking test at your next appointment.  TESTS ORDERED: 1. Full PFTs on or before next appointment 2. 6MWT on room air on or before next appointment 3. Serum ESR, CRP, Hypersensitivity Pneumonitis Panel, ANA, Rheumatoid Factor, Anti-CCP, SSA, SSB, & SCL-70.

## 2016-01-25 NOTE — Progress Notes (Signed)
Subjective:    Patient ID: Kitai Purdom, male    DOB: 1935-09-09, 80 y.o.   MRN: [id number]  HPI He reports he has intermittent chest "congestion" that has been ongoing for 5 weeks or so. He reports it has been any color from green to clear. He reports he has noticed intermittent dyspnea on exertion that is very brief and resolves with exertion. He hasn't noticed any wheezing. He has been taking Robitussin that seems to help his cough and congestion. Denies any chest tightness, pressure, or pain. No reflux, dyspepsia, or morning brash water taste. Does bruise easily. No rashes. He does have joint pain in his knees. He reports no recent joint swelling and no erythema. He denies any stiffness in his joints. No dysphagia or odynophagia. No Raynaud's. No fever or sweats. He has noticed a chill occasionally. Denies any history of treatment with chemotherapeutics, amiodarone, or radiation exposure.   Review of Systems No dry eyes, dry mouth or oral ulcers. No dysuria or hematuria. A pertinent 14 point review of systems is negative except as per the history of presenting illness.  No Known Allergies  Current Outpatient Prescriptions on File Prior to Visit  Medication Sig Dispense Refill  . aspirin 81 MG tablet Take 81 mg by mouth daily.      . carvedilol (COREG) 25 MG tablet TAKE 1 TABLET TWICE DAILY WITH A MEAL 180 tablet 3  . losartan (COZAAR) 25 MG tablet TAKE 1 TABLET EVERY DAY 90 tablet 3  . NITROSTAT 0.4 MG SL tablet DISSOLVE ONE TABLET UNDER THE TONGUE EVERY 5 MINUTES AS NEEDED. 25 tablet 10  . simvastatin (ZOCOR) 20 MG tablet TAKE 1 TABLET AT BEDTIME 90 tablet 3  . tamsulosin (FLOMAX) 0.4 MG CAPS capsule Take 0.4 mg by mouth daily after supper.    . travoprost, benzalkonium, (TRAVATAN) 0.004 % ophthalmic solution 1 drop at bedtime.      Marland Kitchen albuterol (PROVENTIL HFA;VENTOLIN HFA) 108 (90 Base) MCG/ACT inhaler Inhale 2 puffs into the lungs every 6 (six) hours as needed for wheezing or  shortness of breath. (Patient not taking: Reported on 01/25/2016) 18 g 0  . budesonide-formoterol (SYMBICORT) 160-4.5 MCG/ACT inhaler Inhale 2 puffs into the lungs 2 (two) times daily. (Patient not taking: Reported on 01/25/2016) 1 Inhaler 3   No current facility-administered medications on file prior to visit.     Past Medical History:  Diagnosis Date  . ALLERGIC RHINITIS   . ANXIETY   . BENIGN PROSTATIC HYPERTROPHY, HX OF   . CARDIOMYOPATHY    EF 45% 2012 echo.    . CARPAL TUNNEL SYNDROME, LEFT   . DEGENERATIVE JOINT DISEASE, KNEES, BILATERAL   . DEPRESSION   . FATIGUE   . GLAUCOMA   . GLUCOSE INTOLERANCE   . HYPERCHOLESTEROLEMIA   . HYPERLIPIDEMIA   . MYOCARDIAL INFARCTION, HX OF    LAD occlusion treated with stent x 2 (HORIZON Study) 2006.  100% RCA and D2 managed medically.  40% circumflex stenosis.   . OSTEOARTHRITIS   . SINUSITIS- ACUTE-NOS   . URI     Past Surgical History:  Procedure Laterality Date  . CATARACT EXTRACTION Left   . TURP VAPORIZATION  05/15/85    Family History  Problem Relation Age of Onset  . Stroke Mother   . Heart attack Father   . Cirrhosis Brother     alcohol induced  . Mesothelioma [place]   . Diabetes Brother   . Stroke Brother   . Rheum  arthritis Other   . Lung disease Neg Hx     Social History   Social History  . Marital status: Divorced    Spouse name: N/A  . Number of children: N/A  . Years of education: N/A   Occupational History  . semi-retired truck driver     Social History Main Topics  . Smoking status: Former Smoker    Packs/day: 1.00    Years: 16.00    Quit date: 05/16/1991  . Smokeless tobacco: Never Used     Comment: smoked off and on for 16 years.   . Alcohol use Yes     Comment: one glass of wine daily  . Drug use: No  . Sexual activity: Not Asked   Other Topics Concern  . None   Social History Narrative   Grew up in [place]. Enjoys trucks, reading and TV.       Pulmonary:   Originally from  [place], [place]. Moved to [place] in 1993. Has traveled to [facility] as well. 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 He has very limited if any exposure to asbestos through his work in the [employer]. No bird, mold, or hot tub exposure. Enjoys watching TV.       Objective:   Physical Exam BP 102/70 (BP Location: Left Arm, Cuff Size: Normal)   Pulse 88   Ht _0  (1.778 m)   Wt 186 lb 6.4 oz (84.6 kg)   SpO2 95%   BMI 26.75 kg/m  General:  Awake. Alert. No acute distress.  Integument:  Warm & dry. No rash on exposed skin. No bruising. Lymphatics:  No appreciated cervical or supraclavicular lymphadenoapthy. HEENT:  Moist mucus membranes. No oral ulcers. No scleral injection or icterus.  Cardiovascular:  Regular rate. No edema. Normal S1 & S2. Pulmonary:  Crackles are present in the mid and lower lung zones bilaterally. Symmetric chest wall expansion. No accessory muscle use. Abdomen: Soft. Normal bowel sounds. Nondistended. Grossly nontender. Musculoskeletal:  Normal bulk and tone. Hand grip strength 5/5 bilaterally. No joint deformity or effusion appreciated. Neurological:  CN 2-12 grossly in tact. No meningismus. Moving all 4 extremities equally. Symmetric brachioradialis deep tendon reflexes. Psychiatric:  Mood and affect congruent. Speech normal rhythm, rate & tone.   IMAGING CT CHEST W/O 12/24/15 (personally reviewed by me): Notable mediastinal lymphadenopathy with subcarinal lymph node measuring 1.47 m in short axis. No pleural effusion or thickening. Bilateral mid and lower lung zone predominant linear opacification with subpleural reticular changes consistent with fibrosis and evidence of traction bronchiectasis.  CARDIAC TTE (04/11/11): Mild LV dilation with EF 45-50 %.akinesis of anteroseptal & apical myocardium. Grade 1 diastolic dysfunction. LA mildly dilated & RA normal  in size. RV normal in size and function. No aortic stenosis or regurgitation. Aortic root normal in size. Mild mitral regurgitation without stenosis. Trivial tricuspid regurgitation. Mild pulmonic regurgitation. No pericardial effusion.     Assessment & Plan:  80 y.o. male with Evidence of interstitial lung disease and traction bronchiectasis on his chest CT scan reviewed today with him during his visit. I do suspect this could be IPF but given the presumably atypical pattern seen on his chest CT scan I'm still unsure. He has no symptoms that would suggest an autoimmune etiology nor does he have significant prior exposures that would indicate an alternative cause for the fibrotic changes I'm appreciating. We did  discuss performing a high-resolution CT scan but as the patient would like to try to contain costs we will simply assess his lung function and perform a serum autoimmune workup. I do believe that his cough and chest congestion is likely due to his traction bronchiectasis and that he would benefit from some airway clearance maneuvers. In the absence of an obvious autoimmune disease at this time I do not feel that initiating immunosuppression will be of great benefit to the patient. Additionally without a high-resolution CT scan to confirm my suspicion for underlying UIP/IPF I will not be starting the patient on Ofev or Esbriet. Additionally, I'm holding off on surgical lung biopsy pending further serum workup. I instructed the patient to contact my office if he had any new breathing problems or questions before his next appointment.  1. ILD: Checking full pulmonary function testing and 6 minute walk test on or before next appointment. Holding off on initiating treatment at this time. Checking serum ESR, CRP, hypersensitivity pneumonitis panel, ANA with reflex, rheumatoid factor, anti-CCP, SSA, SSB, and SCL 70. 2. Traction Bronchiectasis:  Airway clearance with Acapella bid. 3. Health Maintenance:  S/P  Prevnar 26 December 2015 & Pneumovax 05 November 2010. Administering high dose influenza vaccine today. 4. Follow-up: Patient to return to clinic in 6 weeks or sooner if needed.  Sonia Baller Ashok Cordia, M.D. [facility] Pulmonary & Critical Care Pager:  [phone] After 3pm or if no response, call [phone] 3:52 PM 01/25/16

## 2016-01-25 NOTE — Telephone Encounter (Signed)
JN is not in office in 6 weeks. He is here week of 02/28/16 so pt has been scheduled for 53mw and OV 02/29/16. Nothing further needed.

## 2016-01-26 LAB — RHEUMATOID FACTOR: Rhuematoid fact SerPl-aCnc: <14 IU/mL (ref <14)

## 2016-01-26 LAB — ANA W/REFLEX: ANA: NEGATIVE

## 2016-01-26 LAB — CYCLIC CITRUL PEPTIDE ANTIBODY, IGG: Cyclic Citrullin Peptide Ab: <16 Units

## 2016-01-27 LAB — SJOGRENS SYNDROME-B EXTRACTABLE NUCLEAR ANTIBODY
SSB (LA) (ENA) ANTIBODY, IGG: NEGATIVE
SSB (La) (ENA) Antibody, IgG: <1

## 2016-01-27 LAB — SJOGRENS SYNDROME-A EXTRACTABLE NUCLEAR ANTIBODY
SSA (RO) (ENA) ANTIBODY, IGG: NEGATIVE
SSA (Ro) (ENA) Antibody, IgG: <1

## 2016-01-27 LAB — ANTI-SCLERODERMA ANTIBODY: SCLERODERMA (SCL-70) (ENA) ANTIBODY, IGG: NEGATIVE

## 2016-01-30 LAB — HYPERSENSITIVITY PNUEMONITIS PROFILE

## 2016-01-31 ENCOUNTER — Telehealth: Payer: Self-pay | Admitting: Pulmonary Disease

## 2016-01-31 MED ORDER — ACAPELLA MISC: 0 refills | Status: AC

## 2016-01-31 NOTE — Telephone Encounter (Signed)
Spoke with pt. He is having a hard time with finding somewhere that will supply him with a flutter valve. We had a shipment come in to our office. The proper paperwork and prescription have been placed in JN's look at to be signed. Mindy will return this to triage once it's signed. A flutter valve is in triage with the pt's name on it.

## 2016-02-01 NOTE — Telephone Encounter (Signed)
Pt came yesterday to pick this up. It was demonstrated with the pt on how to use it. All documents were placed in the [facility] folder. Nothing further was needed.

## 2016-02-08 ENCOUNTER — Encounter (INDEPENDENT_AMBULATORY_CARE_PROVIDER_SITE_OTHER): Payer: Medicare Other | Admitting: Pulmonary Disease

## 2016-02-08 DIAGNOSIS — J849 Interstitial pulmonary disease, unspecified: Secondary | ICD-10-CM

## 2016-02-08 LAB — PULMONARY FUNCTION TEST
DL/VA % PRED: 60 %
DL/VA: 2.74 ml/min/mmHg/L
DLCO COR % PRED: 30 %
DLCO COR: 9.49 ml/min/mmHg
DLCO UNC % PRED: 31 %
DLCO unc: 9.8 ml/min/mmHg
FEF 25-75 PRE: 2.64 L/s
FEF 25-75 Post: 2.7 L/sec
FEF2575-%Change-Post: 2 %
FEF2575-%Pred-Post: 136 %
FEF2575-%Pred-Pre: 133 %
FEV1-%Change-Post: 0 %
FEV1-%Pred-Post: 62 %
FEV1-%Pred-Pre: 62 %
FEV1-Post: 1.77 L
FEV1-Pre: 1.77 L
FEV1FVC-%Change-Post: 4 %
FEV1FVC-%PRED-PRE: 121 %
FEV6-%CHANGE-POST: -3 %
FEV6-%PRED-POST: 52 %
FEV6-%PRED-PRE: 54 %
FEV6-POST: 1.93 L
FEV6-PRE: 2.01 L
FEV6FVC-%CHANGE-POST: 0 %
FEV6FVC-%PRED-POST: 107 %
FEV6FVC-%PRED-PRE: 106 %
FVC-%Change-Post: -4 %
FVC-%PRED-POST: 48 %
FVC-%Pred-Pre: 51 %
FVC-POST: 1.93 L
FVC-Pre: 2.02 L
PRE FEV6/FVC RATIO: 99 %
Post FEV1/FVC ratio: 92 %
Post FEV6/FVC ratio: 100 %
Pre FEV1/FVC ratio: 88 %
RV % pred: 55 %
RV: 1.41 L
TLC % pred: 50 %
TLC: 3.48 L

## 2016-02-29 ENCOUNTER — Ambulatory Visit (INDEPENDENT_AMBULATORY_CARE_PROVIDER_SITE_OTHER): Payer: Medicare Other | Admitting: Pulmonary Disease

## 2016-02-29 ENCOUNTER — Encounter: Payer: Self-pay | Admitting: Pulmonary Disease

## 2016-02-29 VITALS — BP 134/68 | HR 89 | Ht 70.0 in | Wt 185.0 lb

## 2016-02-29 DIAGNOSIS — J984 Other disorders of lung: Secondary | ICD-10-CM | POA: Diagnosis not present

## 2016-02-29 DIAGNOSIS — R0600 Dyspnea, unspecified: Secondary | ICD-10-CM | POA: Diagnosis not present

## 2016-02-29 DIAGNOSIS — J849 Interstitial pulmonary disease, unspecified: Secondary | ICD-10-CM | POA: Diagnosis not present

## 2016-02-29 DIAGNOSIS — J479 Bronchiectasis, uncomplicated: Secondary | ICD-10-CM | POA: Diagnosis not present

## 2016-02-29 NOTE — Progress Notes (Signed)
Subjective:    Patient ID: Juan Mcdaniel, male    DOB: Oct 18, 1935, 80 y.o.   MRN: [id number]  C.C.:  Follow-up for ILD & Traction Bronchiectasis.  HPI ILD:  He reports his breathing is doing "pretty good". He reports he does get some congestion at night and in the evening and is coughing up a white mucus.   Traction Bronchiectasis:  On airway clearance with flutter valve. He reports he is using his flutter valve sporadically. Denies any hemoptysis.   Review of Systems No fever, chills, or sweats. No chest pain or pressure. No abdominal pain, nausea, or reflux. No morning brash water taste.   No Known Allergies  Current Outpatient Prescriptions on File Prior to Visit  Medication Sig Dispense Refill  . aspirin 81 MG tablet Take 81 mg by mouth daily.      . carvedilol (COREG) 25 MG tablet TAKE 1 TABLET TWICE DAILY WITH A MEAL 180 tablet 3  . losartan (COZAAR) 25 MG tablet TAKE 1 TABLET EVERY DAY 90 tablet 3  . Misc. Devices (ACAPELLA) MISC Use as directed 1 each 0  . NITROSTAT 0.4 MG SL tablet DISSOLVE ONE TABLET UNDER THE TONGUE EVERY 5 MINUTES AS NEEDED. 25 tablet 10  . simvastatin (ZOCOR) 20 MG tablet TAKE 1 TABLET AT BEDTIME 90 tablet 3  . tamsulosin (FLOMAX) 0.4 MG CAPS capsule Take 0.4 mg by mouth daily after supper.    . travoprost, benzalkonium, (TRAVATAN) 0.004 % ophthalmic solution 1 drop at bedtime.      Marland Kitchen albuterol (PROVENTIL HFA;VENTOLIN HFA) 108 (90 Base) MCG/ACT inhaler Inhale 2 puffs into the lungs every 6 (six) hours as needed for wheezing or shortness of breath. (Patient not taking: Reported on 02/29/2016) 18 g 0  . budesonide-formoterol (SYMBICORT) 160-4.5 MCG/ACT inhaler Inhale 2 puffs into the lungs 2 (two) times daily. (Patient not taking: Reported on 02/29/2016) 1 Inhaler 3   No current facility-administered medications on file prior to visit.     Past Medical History:  Diagnosis Date  . ALLERGIC RHINITIS   . ANXIETY   . BENIGN PROSTATIC HYPERTROPHY, HX  OF   . CARDIOMYOPATHY    EF 45% 2012 echo.    . CARPAL TUNNEL SYNDROME, LEFT   . DEGENERATIVE JOINT DISEASE, KNEES, BILATERAL   . DEPRESSION   . FATIGUE   . GLAUCOMA   . GLUCOSE INTOLERANCE   . HYPERCHOLESTEROLEMIA   . HYPERLIPIDEMIA   . MYOCARDIAL INFARCTION, HX OF    LAD occlusion treated with stent x 2 (HORIZON Study) 2006.  100% RCA and D2 managed medically.  40% circumflex stenosis.   . OSTEOARTHRITIS   . SINUSITIS- ACUTE-NOS   . URI     Past Surgical History:  Procedure Laterality Date  . CATARACT EXTRACTION Left   . TURP VAPORIZATION  05/15/85    Family History  Problem Relation Age of Onset  . Stroke Mother   . Heart attack Father   . Cirrhosis Brother     alcohol induced  . Mesothelioma [place]   . Diabetes Brother   . Stroke Brother   . Rheum arthritis Other   . Lung disease Neg Hx     Social History   Social History  . Marital status: Divorced    Spouse name: N/A  . Number of children: N/A  . Years of education: N/A   Occupational History  . semi-retired truck driver     Social History Main Topics  . Smoking status: Former Smoker  Packs/day: 1.00    Years: 16.00    Quit date: 05/16/1991  . Smokeless tobacco: Never Used     Comment: smoked off and on for 16 years.   . Alcohol use Yes     Comment: one glass of wine daily  . Drug use: No  . Sexual activity: Not Asked   Other Topics Concern  . None   Social History Narrative   Grew up in [place]. Enjoys trucks, reading and TV.      [facility]:   Originally from [place], [place]. Moved to [place] in 1993. Has traveled to [facility] as well. 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. He has very limited if any exposure to asbestos through his work in the [employer]. No bird, mold, or hot tub exposure. Enjoys watching TV.       Objective:   Physical Exam BP 134/68 (BP Location: Right  Arm, Cuff Size: Normal)   Pulse 89   Ht 5' 10"  (1.778 m)   Wt 185 lb (83.9 kg)   SpO2 95%   BMI 26.54 kg/m  General:  Awake. Alert. No distress.  Integument:  Warm & dry. No rash on exposed skin.  Lymphatics:  No appreciated cervical or supraclavicular lymphadenoapthy. HEENT:  Moist mucus membranes. No oral ulcers. No scleral icterus.  Cardiovascular:  Regular rate and rhythm. No edema. Normal S1 & S2. Pulmonary:  Bibasilar crackles to the mid lung zones persist with intermittent squeaks in the upper lung zones. Good aeration bilaterally otherwise. Normal work of breathing on room air without accessory muscle use. Musculoskeletal:  Normal bulk and tone. No joint deformity or effusion appreciated.  PFT 02/08/16: FVC 2.02 L (51%) FEV1 1.77 L (62%) FEV1/FVC 0.88 FEF 25-75 2.64 L (133%) negative bronchodilator response TLC 3.48 L (50%) RV 55% ERV 59% DLCO corrected 30% (Hgb 15.8)  6MWT 02/29/16:  Walked 392 meters / Baseline Sat 94% on RA / Nadir Sat 89% on RA @ end of test  IMAGING CT CHEST W/O 12/24/15 (previously reviewed by me): Notable mediastinal lymphadenopathy with subcarinal lymph node measuring 1.47 m in short axis. No pleural effusion or thickening. Bilateral mid and lower lung zone predominant linear opacification with subpleural reticular changes consistent with fibrosis and evidence of traction bronchiectasis.  CARDIAC TTE (04/11/11): Mild LV dilation with EF 45-50 %.akinesis of anteroseptal & apical myocardium. Grade 1 diastolic dysfunction. LA mildly dilated & RA normal in size. RV normal in size and function. No aortic stenosis or regurgitation. Aortic root normal in size. Mild mitral regurgitation without stenosis. Trivial tricuspid regurgitation. Mild pulmonic regurgitation. No pericardial effusion.   LABS 01/25/16 CRP: 0.1 ESR: 29 ANA: Negative Hypersensitivity Pneumonitis Panel:  Negative Anti-CCP:  <16 RF:  <14 SSA:  <1.0 SSB:  <1.0 SCL-70:  <1.0    Assessment &  Plan:  80 y.o. male with interstitial lung disease based on his prior chest CT scan.  I reviewed his pulmonary function testing today with him which does show moderate restriction and his lung volumes with a severely reduced carbon monoxide diffusion capacity. He does continue to have a productive cough but is not routinely using his flutter valve for airway clearance. I did spend a significant amount of time today discussing his negative serologies which leads me towards the diagnosis of idiopathic pulmonary fibrosis present without a high-resolution CT scan or surgical lung biopsy I do not  feel initiating him empirically on treatment with  Esbriet or Ofev is prudent. Additionally, we did briefly discuss the potential side effect profile of both of these medications and the patient himself reports he would not wish to initiate treatment at this time. Given his reluctance to initiate any further treatments I'm holding off on referral for surgical lung biopsy as well. I instructed the patient contact my office if he had any new breathing problems or questions before his next appointment.   1. ILD: Patient declining further testing at this time. Repeat spirometry with DLCO at next appointment. Repeat 6 minute walk test on room air at next appointment. 2. Traction Bronchiectasis:  Patient counseled on appropriate frequency and use of flutter valve. 3. Health Maintenance:  S/P Prevnar 26 December 2015, Pneumovax 05 November 2010, & Influenza Vaccine September 2017. 4. Follow-up: Patient to return to clinic in 6 months or sooner if needed.  Sonia Baller Ashok Cordia, M.D. [facility] Pulmonary & Critical Care Pager:  [phone] After 3pm or if no response, call [phone] 3:54 PM 02/29/16

## 2016-02-29 NOTE — Progress Notes (Signed)
Test reviewed.  

## 2016-02-29 NOTE — Patient Instructions (Signed)
   Call me if you start to have any new breathing problems before your next appointment.  I will see you back in 6 months or sooner if needed.  TESTS ORDERED: 1. Spirometry with DLCO at follow-up appointment 2. 6MWT on room air at next appointment

## 2016-03-01 DIAGNOSIS — Z85828 Personal history of other malignant neoplasm of skin: Secondary | ICD-10-CM | POA: Diagnosis not present

## 2016-03-01 DIAGNOSIS — Z23 Encounter for immunization: Secondary | ICD-10-CM | POA: Diagnosis not present

## 2016-03-01 DIAGNOSIS — L570 Actinic keratosis: Secondary | ICD-10-CM | POA: Diagnosis not present

## 2016-03-06 ENCOUNTER — Other Ambulatory Visit: Payer: Self-pay | Admitting: Cardiology

## 2016-03-06 NOTE — Telephone Encounter (Signed)
Rx request sent to pharmacy.  

## 2016-03-09 ENCOUNTER — Telehealth: Payer: Self-pay | Admitting: Pulmonary Disease

## 2016-03-09 NOTE — Telephone Encounter (Signed)
Spoke with the pt  He states he would like to 2 months off of work to see if his condition improves  He states that overall he is doing well, and has not had any changes since his last visit here  He just wants to see if staying home will help him improve even more  If you are okay with writing letter, he wants [place] to mail it to him  Please advise, thanks!

## 2016-03-10 NOTE — Telephone Encounter (Signed)
I don't think that his work is contributing to his ILD. There are no exposures that would provide a cause. Is there something particular he is being exposed to at work that he feels could be contributing?

## 2016-03-10 NOTE — Telephone Encounter (Signed)
lmtcb x1 for pt. 

## 2016-03-14 ENCOUNTER — Telehealth: Payer: Self-pay | Admitting: Cardiology

## 2016-03-14 ENCOUNTER — Encounter: Payer: Self-pay | Admitting: Pulmonary Disease

## 2016-03-14 NOTE — Telephone Encounter (Signed)
There has been a recall placed to contact pt closer to that 30mo mark in regards to scheduling a f/u. I have made pt aware of JN recommendations. Pt would like letter mailed--address on file verified. Letter placed up front to be mailed out. Pt voiced understanding and had no further questions. Nothing further needed.

## 2016-03-14 NOTE — Telephone Encounter (Signed)
Follow up  Patient says refill request should be faxed to [facility]: [phone]

## 2016-03-14 NOTE — Telephone Encounter (Signed)
I have done a letter in his record to be taken to his employer. Let me know that he should contact our office at the end of the 4 week period to see if he has noticed any change. Currently he isn't scheduled to see me until 6 months from his last visit earlier this month and that appointment has not yet been scheduled.

## 2016-03-14 NOTE — Telephone Encounter (Signed)
lmomtcb x 2  

## 2016-03-14 NOTE — Telephone Encounter (Signed)
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  He is aware that we will forward this back to [facility] and make him aware.

## 2016-03-14 NOTE — Telephone Encounter (Signed)
New Message   *STAT* If patient is at the pharmacy, call can be transferred to refill team.   1. Which medications need to be refilled? (please list name of each medication and dose if known)  Caredilol 25 mg tablet twice daily Losartan 25 mg tablet once daily Simvastatin 20 mg tablet once daily  2. Which pharmacy/location (including street and city if local pharmacy) is medication to be sent to? [facility]  3. Do they need a 30 day or 90 day supply? 90 day

## 2016-03-30 DIAGNOSIS — Z961 Presence of intraocular lens: Secondary | ICD-10-CM | POA: Diagnosis not present

## 2016-03-30 DIAGNOSIS — H401131 Primary open-angle glaucoma, bilateral, mild stage: Secondary | ICD-10-CM | POA: Diagnosis not present

## 2016-03-30 DIAGNOSIS — H2511 Age-related nuclear cataract, right eye: Secondary | ICD-10-CM | POA: Diagnosis not present

## 2016-04-11 ENCOUNTER — Other Ambulatory Visit: Payer: Self-pay | Admitting: *Deleted

## 2016-04-11 MED ORDER — BUDESONIDE-FORMOTEROL FUMARATE 160-4.5 MCG/ACT IN AERO: 2.0000 | INHALATION_SPRAY | Freq: Two times a day (BID) | RESPIRATORY_TRACT | 2 refills | Status: DC

## 2016-04-11 NOTE — Telephone Encounter (Signed)
Rec'd call pt states he can get his symbicort inhaler cheaper from [place]. Req rx to be sent to [facility]. Inform pt sending rx electronically as we speak...lmb

## 2016-04-19 NOTE — Progress Notes (Signed)
HPI The patient presents for follow up of CAD.  In 2012 he had a  [employer].  His EF to be 48% which was about his previous. He had a large inferior infarct consistent with his previous right coronary occlusion. There was some apical anteroseptal ischemia with mild reversibility.  In the absence of symptoms we managed this medically.  Since then he has had no new cardiac symptoms.  The patient denies any new symptoms such as neck or arm discomfort. There has been no new PND or orthopnea. There have been no reported palpitations, presyncope or syncope.  He does have some SOB and has been diagnosed and has ongoing treatment for an ILD. He has some mild chest discomfort with this.    No Known Allergies  Current Outpatient Prescriptions  Medication Sig Dispense Refill  . albuterol (PROVENTIL HFA;VENTOLIN HFA) 108 (90 Base) MCG/ACT inhaler Inhale 2 puffs into the lungs every 6 (six) hours as needed for wheezing or shortness of breath. 18 g 0  . aspirin 81 MG tablet Take 81 mg by mouth daily.      . budesonide-formoterol (SYMBICORT) 160-4.5 MCG/ACT inhaler Inhale 2 puffs into the lungs 2 (two) times daily. 3 Inhaler 2  . carvedilol (COREG) 25 MG tablet TAKE 1 TABLET TWICE DAILY WITH A MEAL 180 tablet 3  . losartan (COZAAR) 25 MG tablet TAKE 1 TABLET EVERY DAY 90 tablet 3  . Misc. Devices (ACAPELLA) MISC Use as directed 1 each 0  . NITROSTAT 0.4 MG SL tablet DISSOLVE ONE TABLET UNDER THE TONGUE EVERY 5 MINUTES AS NEEDED. 25 tablet 10  . simvastatin (ZOCOR) 20 MG tablet TAKE 1 TABLET AT BEDTIME 90 tablet 3  . tamsulosin (FLOMAX) 0.4 MG CAPS capsule Take 0.4 mg by mouth daily after supper.    . travoprost, benzalkonium, (TRAVATAN) 0.004 % ophthalmic solution 1 drop at bedtime.       No current facility-administered medications for this visit.     Past Medical History:  Diagnosis Date  . ALLERGIC RHINITIS   . ANXIETY   . BENIGN PROSTATIC HYPERTROPHY, HX OF   . CARDIOMYOPATHY    EF  45% 2012 echo.    . CARPAL TUNNEL SYNDROME, LEFT   . DEGENERATIVE JOINT DISEASE, KNEES, BILATERAL   . DEPRESSION   . FATIGUE   . GLAUCOMA   . GLUCOSE INTOLERANCE   . HYPERCHOLESTEROLEMIA   . HYPERLIPIDEMIA   . MYOCARDIAL INFARCTION, HX OF    LAD occlusion treated with stent x 2 (HORIZON Study) 2006.  100% RCA and D2 managed medically.  40% circumflex stenosis.   . OSTEOARTHRITIS   . SINUSITIS- ACUTE-NOS   . URI     Past Surgical History:  Procedure Laterality Date  . CATARACT EXTRACTION Left   . TURP VAPORIZATION  05/15/85    ROS:  Cough. As stated in the HPI and negative for all other systems.  PHYSICAL EXAM BP 114/70   Pulse 69   Ht 5\' 9"  (1.753 m)   Wt 179 lb 6.4 oz (81.4 kg)   BMI 26.49 kg/m  GENERAL:  Well appearing HEENT:  Pupils equal round and reactive, fundi not visualized, oral mucosa unremarkable NECK:  No jugular venous distention, waveform within normal limits, carotid upstroke brisk and symmetric, no bruits, no thyromegaly LUNGS:  Wheezing and decreased breathsounds.  HEART:  PMI not displaced or sustained,S1 and S2 within normal limits, no S3, no S4, no clicks, no rubs, no murmurs ABD:  Flat, positive  bowel sounds normal in frequency in pitch, no bruits, no rebound, no guarding, no midline pulsatile mass, no hepatomegaly, no splenomegaly EXT:  2 plus pulses throughout, no edema, no cyanosis no clubbing  EKG:     Normal sinus rhythm, rate 69 , axis within normal limits, intervals within normal limits, old anteroseptal infarct.  This is unchanged from last year. 04/20/2016  Lab Results  Component Value Date   CHOL 128 04/27/2015   TRIG 89 04/27/2015   HDL 35 (L) 04/27/2015   LDLCALC 75 04/27/2015    ASSESSMENT AND PLAN  CORONARY ARTERY DISEASE -  The patient has no new sypmtoms.  No further cardiovascular testing is indicated.  We will continue with aggressive risk reduction and meds as listed.  (Of note he takes 1/2 of the above dose of beta blocker.)   No change in therapy is planned.  HYPERTENSION -  The blood pressure is at target. No change in medications is indicated. We will continue with therapeutic lifestyle changes (TLC).  HYPERLIPIDEMIA -  His labs are as above.  No change in therapy.

## 2016-04-20 ENCOUNTER — Ambulatory Visit (INDEPENDENT_AMBULATORY_CARE_PROVIDER_SITE_OTHER): Payer: Medicare Other | Admitting: Cardiology

## 2016-04-20 ENCOUNTER — Encounter: Payer: Self-pay | Admitting: Cardiology

## 2016-04-20 VITALS — BP 114/70 | HR 69 | Ht 69.0 in | Wt 179.4 lb

## 2016-04-20 DIAGNOSIS — I2510 Atherosclerotic heart disease of native coronary artery without angina pectoris: Secondary | ICD-10-CM | POA: Diagnosis not present

## 2016-04-20 DIAGNOSIS — I10 Essential (primary) hypertension: Secondary | ICD-10-CM | POA: Diagnosis not present

## 2016-04-20 NOTE — Patient Instructions (Signed)

## 2016-07-26 DIAGNOSIS — N401 Enlarged prostate with lower urinary tract symptoms: Secondary | ICD-10-CM | POA: Diagnosis not present

## 2016-07-26 DIAGNOSIS — R351 Nocturia: Secondary | ICD-10-CM | POA: Diagnosis not present

## 2016-09-05 ENCOUNTER — Ambulatory Visit: Payer: Medicare Other | Admitting: Pulmonary Disease

## 2016-09-05 ENCOUNTER — Ambulatory Visit: Payer: Medicare Other

## 2016-10-05 DIAGNOSIS — H401131 Primary open-angle glaucoma, bilateral, mild stage: Secondary | ICD-10-CM | POA: Diagnosis not present

## 2016-11-07 ENCOUNTER — Encounter: Payer: Self-pay | Admitting: Pulmonary Disease

## 2016-11-07 ENCOUNTER — Ambulatory Visit (INDEPENDENT_AMBULATORY_CARE_PROVIDER_SITE_OTHER): Payer: Medicare HMO | Admitting: *Deleted

## 2016-11-07 ENCOUNTER — Ambulatory Visit (INDEPENDENT_AMBULATORY_CARE_PROVIDER_SITE_OTHER): Payer: Medicare HMO | Admitting: Pulmonary Disease

## 2016-11-07 VITALS — BP 118/60 | HR 72 | Ht 69.0 in | Wt 184.0 lb

## 2016-11-07 DIAGNOSIS — J9611 Chronic respiratory failure with hypoxia: Secondary | ICD-10-CM

## 2016-11-07 DIAGNOSIS — J849 Interstitial pulmonary disease, unspecified: Secondary | ICD-10-CM

## 2016-11-07 DIAGNOSIS — J479 Bronchiectasis, uncomplicated: Secondary | ICD-10-CM

## 2016-11-07 DIAGNOSIS — J984 Other disorders of lung: Secondary | ICD-10-CM

## 2016-11-07 DIAGNOSIS — R0600 Dyspnea, unspecified: Secondary | ICD-10-CM

## 2016-11-07 LAB — PULMONARY FUNCTION TEST
DL/VA % pred: 66 %
DL/VA: 3.02 ml/min/mmHg/L
DLCO COR % PRED: 28 %
DLCO COR: 8.94 ml/min/mmHg
DLCO UNC % PRED: 30 %
DLCO UNC: 9.5 ml/min/mmHg
FEF 25-75 Pre: 2.2 L/sec
FEF2575-%PRED-PRE: 113 %
FEV1-%Pred-Pre: 54 %
FEV1-PRE: 1.53 L
FEV1FVC-%PRED-PRE: 123 %
FEV6-%PRED-PRE: 47 %
FEV6-Pre: 1.72 L
FEV6FVC-%Pred-Pre: 107 %
FVC-%Pred-Pre: 43 %
FVC-Pre: 1.72 L
PRE FEV1/FVC RATIO: 89 %
Pre FEV6/FVC Ratio: 100 %

## 2016-11-07 MED ORDER — ALBUTEROL SULFATE HFA 108 (90 BASE) MCG/ACT IN AERS: 2.0000 | INHALATION_SPRAY | RESPIRATORY_TRACT | 3 refills | Status: DC | PRN

## 2016-11-07 NOTE — Progress Notes (Signed)
SIX MIN WALK 11/07/2016 02/29/2016  Medications Proventil at 7am, Coreg 12.5mg and Losartan 25mg at 7am.  Losartan 25mg and Carvedilol 25mg at 8am  Supplimental Oxygen during Test? (L/min) Yes No  O2 Flow Rate 2 -  Type Continuous -  Laps 7 8  Partial Lap (in Meters) 47 8  Baseline BP (sitting) 112/70 98/64  Baseline Heartrate 74 84  Baseline Dyspnea (Borg Scale) 0 0  Baseline Fatigue (Borg Scale) 0 0  Baseline SPO2 99 94  BP (sitting) 138/64 112/76  Heartrate 101 97  Dyspnea (Borg Scale) 5 1  Fatigue (Borg Scale) 0 0  SPO2 90 89  BP (sitting) 124/68 110/70  Heartrate 75 86  SPO2 100 94  Stopped or Paused before Six Minutes Yes No  Other Symptoms at end of Exercise Stopped at 4 laps with 2mins 59secs left due O2 dropping and staying around 87%. Placed on 2L of o2. Sats recovered to 94% on 2L and test was resumed.  -  Distance Completed [phone]  Tech Comments: Moderate pace. Pt denied any chest pain or dizziness.  Pt walked several laps without stopping and no complaints of SOB, no desat. Pt's sats hovered around 88%-90% breifly then quickly recovered to 94% on room air. Titration walk done for 6 minutes and patient did not desat. --amg   

## 2016-11-07 NOTE — Progress Notes (Signed)
PFT done today. 

## 2016-11-07 NOTE — Patient Instructions (Addendum)
   You can stop using your Symbicort inhaler.  I am sending in a prescription for your Ventolin inhaler to continue using as needed.   Use the portable oxygen concentrator we are setting you up with when you're walking & exerting yourself significantly.  We will contact you about starting treatment for your pulmonary fibrosis once I've reviewed your chest CT scan.  Call me if you have any questions or concerns before your next appointment.  Go back to using a full dose of Mucinex to help with your mucus.   TESTS ORDERED: 1. HRCT CHEST W/O 2. Spirometry with DLCO at next appointment 3. 6MWT with oxygen titration at next appointment using patient's POC

## 2016-11-07 NOTE — Progress Notes (Signed)
Subjective:    Patient ID: Juan Mcdaniel, male    DOB: [birth year], 81 y.o.   MRN: [id number]  C.C.:  Follow-up for ILD & Traction Bronchiectasis.  HPI ILD: Not currently on treatment. Patient declined further workup at last appointment. He does feel his dyspnea is worsening with exertion. Denies any dyspnea at rest. He does feel like his breathing is "a tad" worse comparatively.   Traction bronchiectasis: Previously prescribed flutter valve for airway clearance. He is still producing a white mucus. He reports the mucus is quite "sticky". Denies any hemoptysis. Previously was prescribed Symbicort without help. He did feel his previous Ventolin prescription did seem to help.   Review of Systems No fever, chills, or sweats. No chest pain or pressure. No reflux, dyspepsia, or morning brash water taste.   No Known Allergies  Current Outpatient Prescriptions on File Prior to Visit  Medication Sig Dispense Refill  . albuterol (PROVENTIL HFA;VENTOLIN HFA) 108 (90 Base) MCG/ACT inhaler Inhale 2 puffs into the lungs every 6 (six) hours as needed for wheezing or shortness of breath. 18 g 0  . aspirin 81 MG tablet Take 81 mg by mouth daily.      . budesonide-formoterol (SYMBICORT) 160-4.5 MCG/ACT inhaler Inhale 2 puffs into the lungs 2 (two) times daily. 3 Inhaler 2  . carvedilol (COREG) 25 MG tablet TAKE 1 TABLET TWICE DAILY WITH A MEAL 180 tablet 3  . losartan (COZAAR) 25 MG tablet TAKE 1 TABLET EVERY DAY 90 tablet 3  . Misc. Devices (ACAPELLA) MISC Use as directed 1 each 0  . NITROSTAT 0.4 MG SL tablet DISSOLVE ONE TABLET UNDER THE TONGUE EVERY 5 MINUTES AS NEEDED. 25 tablet 10  . simvastatin (ZOCOR) 20 MG tablet TAKE 1 TABLET AT BEDTIME 90 tablet 3  . travoprost, benzalkonium, (TRAVATAN) 0.004 % ophthalmic solution 1 drop at bedtime.       No current facility-administered medications on file prior to visit.     Past Medical History:  Diagnosis Date  . ALLERGIC RHINITIS   . ANXIETY     . BENIGN PROSTATIC HYPERTROPHY, HX OF   . CARDIOMYOPATHY    EF 45% 2012 echo.    . CARPAL TUNNEL SYNDROME, LEFT   . DEGENERATIVE JOINT DISEASE, KNEES, BILATERAL   . DEPRESSION   . FATIGUE   . GLAUCOMA   . GLUCOSE INTOLERANCE   . HYPERCHOLESTEROLEMIA   . HYPERLIPIDEMIA   . MYOCARDIAL INFARCTION, HX OF    LAD occlusion treated with stent x 2 (HORIZON Study) 2006.  100% RCA and D2 managed medically.  40% circumflex stenosis.   . OSTEOARTHRITIS   . SINUSITIS- ACUTE-NOS   . URI     Past Surgical History:  Procedure Laterality Date  . CATARACT EXTRACTION Left   . TURP VAPORIZATION  05/15/85    Family History  Problem Relation Age of Onset  . Stroke Mother   . Heart attack Father   . Cirrhosis Brother        alcohol induced  . Mesothelioma [place]   . Diabetes Brother   . Stroke Brother   . Rheum arthritis Other   . Lung disease Neg Hx     Social History   Social History  . Marital status: Divorced    Spouse name: N/A  . Number of children: N/A  . Years of education: N/A   Occupational History  . semi-retired truck driver     Social History Main Topics  . Smoking status: Former Smoker  Packs/day: 1.00    Years: 16.00    Quit date: 05/16/1991  . Smokeless tobacco: Never Used     Comment: smoked off and on for 16 years.   . Alcohol use Yes     Comment: one glass of wine daily  . Drug use: No  . Sexual activity: Not Asked   Other Topics Concern  . None   Social History Narrative   Grew up in [place]. Enjoys trucks, reading and TV.      [facility]:   Originally from [place], [place]. Moved to [place] in 1993. Has traveled to [facility] as well. 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 He has very limited if any exposure to asbestos through his work in the [employer]. No bird, mold, or hot tub exposure. Enjoys watching TV.       Objective:    Physical Exam BP 118/60 (BP Location: Right Arm, Cuff Size: Normal)   Pulse 72   Ht _0  (1.753 m)   Wt 184 lb (83.5 kg)   SpO2 93%   BMI 27.17 kg/m   General:  Awake. Alert. No acute distress.  Integument:  Warm & dry. No rash on exposed skin.  Extremities:  No cyanosis or clubbing.  HEENT:  Moist mucus membranes. No oral ulcers. No scleral injection or icterus. No nasal turbinate swelling. Cardiovascular:  Regular rate. No edema. Normal S1 & S2.  Pulmonary:  Mild bilateral basilar crackles. Mild intermittent squeaks in the mid lung zones. No accessory muscle use on room air. Abdomen: Soft. Normal bowel sounds. Mildly protuberant. Musculoskeletal:  Normal bulk and tone. No joint deformity or effusion appreciated.  PFT 11/07/16: FVC 1.72 L (43%) FEV1 1.53 L (54%) FEV1/FVC 0.89 FEF 25-75 2.20 L (113%)                                                                                                          DLCO corrected 28% 02/08/16: FVC 2.02 L (51%) FEV1 1.77 L (62%) FEV1/FVC 0.88 FEF 25-75 2.64 L (133%) negative bronchodilator response TLC 3.48 L (50%) RV 55% ERV 59% DLCO corrected 30% (Hgb 15.8)  6MWT 11/07/16:  Walked 383 meters / Baseline Sat 99% on RA / Nadir Sat 87% on RA @ 3:01 (required 2 L/m to maintain saturation with exertion) 02/29/16:  Walked 392 meters / Baseline Sat 94% on RA / Nadir Sat 89% on RA @ end of test  IMAGING CT CHEST W/O 12/24/15 (previously reviewed by me): Notable mediastinal lymphadenopathy with subcarinal lymph node measuring 1.47 m in short axis. No pleural effusion or thickening. Bilateral mid and lower lung zone predominant linear opacification with subpleural reticular changes consistent with fibrosis and evidence of traction bronchiectasis.  CARDIAC TTE (04/11/11): Mild LV dilation with EF 45-50 %.akinesis of anteroseptal & apical myocardium. Grade 1 diastolic dysfunction. LA mildly dilated & RA normal in size. RV normal in size and function. No aortic  stenosis or regurgitation. Aortic  root normal in size. Mild mitral regurgitation without stenosis. Trivial tricuspid regurgitation. Mild pulmonic regurgitation. No pericardial effusion.   LABS 01/25/16 CRP: 0.1 ESR: 29 ANA: Negative Hypersensitivity Pneumonitis Panel:  Negative Anti-CCP:  <16 RF:  <14 SSA:  <1.0 SSB:  <1.0 SCL-70:  <1.0    Assessment & Plan:  81 y.o. male with interstitial lung disease, traction bronchiectasis, and new hypoxia on exertion today. Patient did previously have significant desaturation but today require oxygen therapy with ambulation due to significant hypoxia below 88%. Given the patient's significant decline in spirometry, worsening oxygen saturation, and increasing symptoms I do feel that reconsideration of treatment with Ofev or Esbriet is reasonable. The patient is willing to consider treatment. We did briefly discuss the potential side effects including nausea, vomiting, liver injury, and diarrhea. Patient will need high-resolution CT imaging to better characterize his interstitial lung disease before we proceed with treatment. I will plan on monitoring hepatic function testing before and during initiation of treatment. I instructed the patient contact my office if he had any new breathing problems or questions before next appointment.   1. ILD: Checking high-resolution CT scan of chest without contrast. Plan to initiate treatment with Ofev pending CT scan result. Repeat spirometry with DLCO at next appointment. Plan to order hepatic function testing prior to and during initiation of treatment. 2. Traction bronchiectasis:  New flutter valve given to patient today. Recommended using full dose of Mucinex daily. Discontinuing Symbicort. Continuing Ventolin inhaler as needed. 3. Chronic hypoxic respiratory failure: Prescribing oxygen via portable oxygen concentrator at 2 L/m. Repeat 6 minute walk test with oxygen titration using his portable oxygen concentrator at  next appointment. 4. Health maintenance: Status post Prevnar 26 December 2015, Pneumovax 05 November 2010, & Influenza Vaccine September 2017. 5. Follow-up: Return to clinic in 3 months or sooner if needed.  Sonia Baller Ashok Cordia, M.D. [facility] Pager:  [phone] After 3pm or if no response, call [phone] 11:26 AM 11/07/16

## 2016-11-09 ENCOUNTER — Telehealth: Payer: Self-pay | Admitting: Pulmonary Disease

## 2016-11-09 MED ORDER — ALBUTEROL SULFATE HFA 108 (90 BASE) MCG/ACT IN AERS: 2.0000 | INHALATION_SPRAY | RESPIRATORY_TRACT | 1 refills | Status: DC | PRN

## 2016-11-09 NOTE — Telephone Encounter (Signed)
I have spoken with pt, who request for Rx for Ventolin to be sent to [facility]. It appears that on 11/07/16 Rx for Ventolin was sent to [facility]. I have called and d/c this Rx. Pt is aware and voiced his understanding. Nothing further needed.

## 2016-11-09 NOTE — Telephone Encounter (Signed)
After talking to pt he then wants to know if his rx's were called into his mail order yesterday and not the local pharmacy cause it ids cheaper can you ck on this thanks

## 2016-11-09 NOTE — Telephone Encounter (Signed)
[facility] faxed a form and it was filled out and sent back to them for this 02 to be taken care of pt was notified Joellen Jersey

## 2016-11-13 ENCOUNTER — Ambulatory Visit (INDEPENDENT_AMBULATORY_CARE_PROVIDER_SITE_OTHER)
Admission: RE | Admit: 2016-11-13 | Discharge: 2016-11-13 | Disposition: A | Discharge status: Home / Self Care | Payer: Medicare HMO | Source: Ambulatory Visit | Attending: Pulmonary Disease | Admitting: Pulmonary Disease

## 2016-11-13 DIAGNOSIS — J849 Interstitial pulmonary disease, unspecified: Secondary | ICD-10-CM | POA: Diagnosis not present

## 2016-11-20 ENCOUNTER — Telehealth: Payer: Self-pay | Admitting: Pulmonary Disease

## 2016-11-20 DIAGNOSIS — J479 Bronchiectasis, uncomplicated: Secondary | ICD-10-CM | POA: Diagnosis not present

## 2016-11-20 DIAGNOSIS — J9611 Chronic respiratory failure with hypoxia: Secondary | ICD-10-CM | POA: Diagnosis not present

## 2016-11-20 DIAGNOSIS — J849 Interstitial pulmonary disease, unspecified: Secondary | ICD-10-CM | POA: Diagnosis not present

## 2016-11-20 MED ORDER — FLUTTER DEVI: 0 refills | Status: AC

## 2016-11-20 NOTE — Telephone Encounter (Signed)
I have spoken with pt in regards to flutter valve that he was to received during his OV on 11/07/16. Pt has requested to come by our office tomorrow and pick flutter valve up. Pt states he does not need demo, as he has been prescribed a flutter device previously. Device has been placed up front for pick up with sheet for patient signature.  Rx for been placed in [place] for signature. Will route to Carleigh to f/u on signature.

## 2016-11-20 NOTE — Telephone Encounter (Signed)
Spoke with pt and he stated he has tried to called several times to [facility] to obtain his oxygen and it has been 2 weeks and no answer. Pt is requesting to go somewhere else. Also pt was concerned because he was told he needed a walk with qualifying sats. Unsure as to why because pt had a 6 min walk on 6/26. I do see where the sats were not placed in the pcc note. This has been done.   PCCs pt wants to go to another DME and he asked can someone call him with the new DME so he can have their phone number for his records

## 2016-11-20 NOTE — Telephone Encounter (Signed)
Called pt and told her I would send the order to [facility] pt is aware. He did ask me again about the flutter valve which I didn't know how to answer for him.

## 2016-11-20 NOTE — Telephone Encounter (Signed)
Routing to Triage I do not see anything on a flutter valve

## 2016-11-20 NOTE — Telephone Encounter (Signed)
Patient is calling to follow up on oxygen. Patient isn't happy with [facility].

## 2016-11-21 NOTE — Telephone Encounter (Signed)
Spoke with patient. He stated that [facility] dropped off O2 tanks yesterday instead of his POC.   Attempted to call [facility] to see why the tanks were delivered. Was placed on hold for over 70mins. Will call back later.

## 2016-11-21 NOTE — Telephone Encounter (Signed)
Patient called and states he was to receive a POC not the tanks - [facility] dropped of tanks - pt can be reached at [phone] -pr

## 2016-11-22 NOTE — Telephone Encounter (Signed)
Juan Mcdaniel with [facility] is returning phone. [phone] Ext: [phone]

## 2016-11-22 NOTE — Telephone Encounter (Addendum)
Spoke with Corene Cornea who states they have taken care of this. He states with new patients they provide them with tanks until they can get them in for POC evaluation. He states they contacted the patient yesterday and spoke with him and arrange for him to get evaluated. He states there is nothing further needed from [place].   LMOM TCB X1 to pt to assure he needs nothing further

## 2016-11-22 NOTE — Telephone Encounter (Signed)
I have called and lmomtcb for [facility] with [facility].

## 2016-11-22 NOTE — Telephone Encounter (Signed)
Spoke with pt and made him aware of below message message. Pt states he is scheduled for evaluation of POC on 11/27/16. Nothing further needed.

## 2016-11-23 NOTE — Telephone Encounter (Signed)
Flutter device was picked up on 11/21/16. [facility] delivery ticket was signed and placed in [facility] folder along with flutter valve prescription. Nothing further is needed.

## 2016-11-24 ENCOUNTER — Other Ambulatory Visit: Payer: Self-pay | Admitting: Pulmonary Disease

## 2016-11-24 DIAGNOSIS — J84112 Idiopathic pulmonary fibrosis: Secondary | ICD-10-CM

## 2016-11-24 NOTE — Progress Notes (Signed)
Paperwork started for [facility] and orders placed for labs. Nothing further is needed.

## 2016-11-29 ENCOUNTER — Other Ambulatory Visit (INDEPENDENT_AMBULATORY_CARE_PROVIDER_SITE_OTHER): Payer: Medicare HMO

## 2016-11-29 DIAGNOSIS — J84112 Idiopathic pulmonary fibrosis: Secondary | ICD-10-CM

## 2016-11-29 LAB — COMPREHENSIVE METABOLIC PANEL
ALT: 17 U/L (ref 0–53)
AST: 17 U/L (ref 0–37)
Albumin: 4 g/dL (ref 3.5–5.2)
Alkaline Phosphatase: 74 U/L (ref 39–117)
BUN: 8 mg/dL (ref 6–23)
CHLORIDE: 107 meq/L (ref 96–112)
CO2: 22 mEq/L (ref 19–32)
CREATININE: 0.76 mg/dL (ref 0.40–1.50)
Calcium: 9.3 mg/dL (ref 8.4–10.5)
GFR: 105.22 mL/min (ref >60.00)
GLUCOSE: 136 mg/dL — AB (ref 70–99)
POTASSIUM: 4.1 meq/L (ref 3.5–5.1)
Sodium: 137 mEq/L (ref 135–145)
Total Bilirubin: 0.9 mg/dL (ref 0.2–1.2)
Total Protein: 7.5 g/dL (ref 6.0–8.3)

## 2016-11-29 LAB — HEPATIC FUNCTION PANEL
ALBUMIN: 4 g/dL (ref 3.5–5.2)
ALT: 17 U/L (ref 0–53)
AST: 17 U/L (ref 0–37)
Alkaline Phosphatase: 74 U/L (ref 39–117)
Bilirubin, Direct: 0.2 mg/dL (ref 0.0–0.3)
TOTAL PROTEIN: 7.5 g/dL (ref 6.0–8.3)
Total Bilirubin: 0.9 mg/dL (ref 0.2–1.2)

## 2016-11-29 LAB — CBC WITH DIFFERENTIAL/PLATELET
BASOS PCT: 1.5 % (ref 0.0–3.0)
Basophils Absolute: 0.1 10*3/uL (ref 0.0–0.1)
EOS ABS: 0.6 10*3/uL (ref 0.0–0.7)
EOS PCT: 6.1 % — AB (ref 0.0–5.0)
HCT: 50.8 % (ref 39.0–52.0)
HEMOGLOBIN: 16.8 g/dL (ref 13.0–17.0)
LYMPHS ABS: 2.8 10*3/uL (ref 0.7–4.0)
Lymphocytes Relative: 30 % (ref 12.0–46.0)
MCHC: 33 g/dL (ref 30.0–36.0)
MCV: 92.6 fl (ref 78.0–100.0)
Monocytes Absolute: 0.6 10*3/uL (ref 0.1–1.0)
Monocytes Relative: 6.6 % (ref 3.0–12.0)
NEUTROS PCT: 55.8 % (ref 43.0–77.0)
Neutro Abs: 5.3 10*3/uL (ref 1.4–7.7)
Platelets: 239 10*3/uL (ref 150.0–400.0)
RBC: 5.48 Mil/uL (ref 4.22–5.81)
RDW: 13.9 % (ref 11.5–15.5)
WBC: 9.5 10*3/uL (ref 4.0–10.5)

## 2016-12-08 ENCOUNTER — Telehealth: Payer: Self-pay | Admitting: Pulmonary Disease

## 2016-12-08 NOTE — Telephone Encounter (Signed)
An OFEV prescription form was faxed to [place] at [phone] for OFEV 150 mg bid with qty 60 and 11 refills. The directions states 12 hours apart with food. A confirmation fax sheet was received. Nothing further is needed.

## 2016-12-11 ENCOUNTER — Telehealth: Payer: Self-pay | Admitting: Pulmonary Disease

## 2016-12-11 NOTE — Telephone Encounter (Signed)
Called and spoke to Golden with Ofev, she is scheduled to meet with pt for [facility] teaching on 8.22.18 at 1430. Nothing further needed at this time.

## 2016-12-13 ENCOUNTER — Telehealth: Payer: Self-pay | Admitting: Pulmonary Disease

## 2016-12-13 NOTE — Telephone Encounter (Signed)
Spoke with Juan Mcdaniel at [name], states she is unable to find any records of a [name] or any PA being requested.  Will close this encounter.

## 2016-12-21 DIAGNOSIS — J479 Bronchiectasis, uncomplicated: Secondary | ICD-10-CM | POA: Diagnosis not present

## 2016-12-21 DIAGNOSIS — J9611 Chronic respiratory failure with hypoxia: Secondary | ICD-10-CM | POA: Diagnosis not present

## 2016-12-21 DIAGNOSIS — J849 Interstitial pulmonary disease, unspecified: Secondary | ICD-10-CM | POA: Diagnosis not present

## 2016-12-22 ENCOUNTER — Telehealth: Payer: Self-pay | Admitting: *Deleted

## 2016-12-22 NOTE — Telephone Encounter (Signed)
Ofev denied. No alternatives given.

## 2016-12-22 NOTE — Telephone Encounter (Signed)
Submitted PA through [facility] for Ofev. Key: [id number]  Your request is pending a decision  Questionnaire submitted. PA Case [id number] Status: Pending review.

## 2016-12-25 NOTE — Telephone Encounter (Signed)
[place] [phone] Member ID # [id number] Spoke with Pieter Partridge -   Per previous telephone notes OFEV 150mg  was supposed to be submitted as 1 po BID #60 and the PA was done for #30 for 30 day supply.   Tyson Dense, RN  Note   5:28 PM  An OFEV prescription form was faxed to [place] at [phone] for OFEV 150 mg bid with qty 60 and 11 refills. The directions states 12 hours apart with food. A confirmation fax sheet was received. Nothing further is needed.      No reason given for denial. A new PA has been started and will have a determination within 24-72 hours .  Ref # [id number]  Will send to Carleigh's box to be followed up on.

## 2016-12-25 NOTE — Telephone Encounter (Signed)
Can you please reach out to the rep and see if we can figure out why this was denied. Thanks.

## 2016-12-27 NOTE — Telephone Encounter (Signed)
Received a fax from [facility]. Juan Mcdaniel has been approved through 12/26/2018.

## 2017-01-10 ENCOUNTER — Other Ambulatory Visit: Payer: Self-pay | Admitting: Cardiology

## 2017-01-21 DIAGNOSIS — J9611 Chronic respiratory failure with hypoxia: Secondary | ICD-10-CM | POA: Diagnosis not present

## 2017-01-21 DIAGNOSIS — J479 Bronchiectasis, uncomplicated: Secondary | ICD-10-CM | POA: Diagnosis not present

## 2017-01-21 DIAGNOSIS — J849 Interstitial pulmonary disease, unspecified: Secondary | ICD-10-CM | POA: Diagnosis not present

## 2017-01-31 ENCOUNTER — Other Ambulatory Visit: Payer: Self-pay | Admitting: Pulmonary Disease

## 2017-02-07 ENCOUNTER — Ambulatory Visit: Payer: Medicare HMO

## 2017-02-07 ENCOUNTER — Ambulatory Visit: Payer: Medicare HMO | Admitting: Pulmonary Disease

## 2017-02-07 NOTE — Progress Notes (Signed)
Subjective:    Patient ID: Juan Mcdaniel, male    DOB: [birth year], 81 y.o.   MRN: [id number]  C.C.:  Follow-up for ILD & Chronic Hypoxic Respiratory Failure.  HPI ILD: Consistent with IPF with UIP pattern on high-resolution CT scan. Traction bronchiectasis on imaging with no symptomatic improvement on Symbicort with questionable previous response to Ventolin inhaler. Prescribed flutter valve for airway clearance. Patient started on Ofev after last appointment. He reports he took it for a few days but felt it was making him nauseated and had diarrhea. He reports he stop taking it after a few days. He reports his dyspnea seems to be progressively worsening. He reports intermittent, nonproductive cough that is unchanged. He reports he does have more dyspnea with laying recumbent.   Chronic hypoxic respiratory failure: Patient's previous walk tests both showed a significant desaturation. Last walk test demonstrated a 2 L/m oxygen requirement to maintain his saturation with ambulation. Today his walk test distance has decreased and his oxygen requirement has increased as well.   Review of Systems No chest pain, tightness, or pressure. No fever or chills. No abdominal pain or diarrhea recently.   No Known Allergies  Current Outpatient Prescriptions on File Prior to Visit  Medication Sig Dispense Refill  . aspirin 81 MG tablet Take 81 mg by mouth daily.      . carvedilol (COREG) 25 MG tablet TAKE 1 TABLET TWICE DAILY WITH A MEAL 180 tablet 3  . losartan (COZAAR) 25 MG tablet TAKE 1 TABLET EVERY DAY 90 tablet 3  . Misc. Devices (ACAPELLA) MISC Use as directed 1 each 0  . NITROSTAT 0.4 MG SL tablet DISSOLVE ONE TABLET UNDER THE TONGUE EVERY 5 MINUTES AS NEEDED. 25 tablet 10  . Respiratory Therapy Supplies (FLUTTER) DEVI Use as directed 1 each 0  . simvastatin (ZOCOR) 20 MG tablet TAKE 1 TABLET AT BEDTIME 90 tablet 3  . travoprost, benzalkonium, (TRAVATAN) 0.004 % ophthalmic solution 1 drop at  bedtime.      . VENTOLIN HFA 108 (90 Base) MCG/ACT inhaler INHALE 2 PUFFS EVERY 4 HOURS AS NEEDED FOR SHORTNESS OF BREATH  OR  WHEEZING 54 g 1   No current facility-administered medications on file prior to visit.     Past Medical History:  Diagnosis Date  . ALLERGIC RHINITIS   . ANXIETY   . BENIGN PROSTATIC HYPERTROPHY, HX OF   . CARDIOMYOPATHY    EF 45% 2012 echo.    . CARPAL TUNNEL SYNDROME, LEFT   . DEGENERATIVE JOINT DISEASE, KNEES, BILATERAL   . DEPRESSION   . FATIGUE   . GLAUCOMA   . GLUCOSE INTOLERANCE   . HYPERCHOLESTEROLEMIA   . HYPERLIPIDEMIA   . MYOCARDIAL INFARCTION, HX OF    LAD occlusion treated with stent x 2 (HORIZON Study) 2006.  100% RCA and D2 managed medically.  40% circumflex stenosis.   . OSTEOARTHRITIS   . SINUSITIS- ACUTE-NOS   . URI     Past Surgical History:  Procedure Laterality Date  . CATARACT EXTRACTION Left   . TURP VAPORIZATION  05/15/85    Family History  Problem Relation Age of Onset  . Stroke Mother   . Heart attack Father   . Cirrhosis Brother        alcohol induced  . Mesothelioma [place]   . Diabetes Brother   . Stroke Brother   . Rheum arthritis Other   . Lung disease Neg Hx     Social History   Social  History  . Marital status: Divorced    Spouse name: N/A  . Number of children: N/A  . Years of education: N/A   Occupational History  . semi-retired truck driver     Social History Main Topics  . Smoking status: Former Smoker    Packs/day: 1.00    Years: 16.00    Quit date: 05/16/1991  . Smokeless tobacco: Never Used     Comment: smoked off and on for 16 years.   . Alcohol use Yes     Comment: one glass of wine daily  . Drug use: No  . Sexual activity: Not Asked   Other Topics Concern  . None   Social History Narrative   Grew up in [place]. Enjoys trucks, reading and TV.      [facility]:   Originally from [place], [place]. Moved to [place] in 1993. Has traveled to [facility] [place] as well. 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 He has very limited if any exposure to asbestos through his work in the [employer]. No bird, mold, or hot tub exposure. Enjoys watching TV.       Objective:   Physical Exam BP 112/60 (BP Location: Left Arm, Cuff Size: Normal)   Pulse 93   Ht _0  (1.753 m)   Wt 180 lb (81.6 kg)   SpO2 95%   BMI 26.58 kg/m    General:  Awake. Alert. No distress. Integument:  Warm & dry. No rash on exposed skin. No bruising on exposed skin. Extremities:  No cyanosis or clubbing.  HEENT:  Moist mucus membranes. No scleral icterus. No oral ulcers. Cardiovascular:  Regular rate. No edema. Regular rhythm.  Pulmonary:  Basilar Velcro crackles unchanged. Mild squeaks in the upper lung zones. Normal work of breathing on supplemental oxygen. Abdomen: Soft. Normal bowel sounds. Protuberant. Musculoskeletal:  Normal bulk and tone. No joint deformity or effusion appreciated.   PFT 02/08/17: FVC 1.54 L (39%) FEV1 1.40 L (50%) FEV1/FVC 0.91 FEF 25 she -75 2.3 L (123%) DLCO corrected 28% 11/07/16: FVC 1.72 L (43%) FEV1 1.53 L (54%) FEV1/FVC 0.89 FEF 25-75 2.20 L (113%)                                                                                                          DLCO corrected 28% 02/08/16: FVC 2.02 L (51%) FEV1 1.77 L (62%) FEV1/FVC 0.88 FEF 25-75 2.64 L (133%) negative bronchodilator response TLC 3.48 L (50%) RV 55% ERV 59% DLCO corrected 30% (Hgb 15.8)  6MWT 02/08/17:  Walked 288 meters / Baseline Sat 98% on 2 L/m / Nadir Sat 88% at 4:33 on 3 L/m (required 4 L/m to maintain with exertino) 11/07/16:  Walked 383 meters / Baseline Sat 99% on RA / Nadir Sat 87% on RA @ 3:01 (required 2 L/m to maintain saturation with exertion) 02/29/16:  Walked 392 meters / Baseline Sat 94% on RA / Nadir Sat 89% on RA @  end of test  IMAGING HRCT CHEST W/O 11/13/16 (personally reviewed by me):   Subcarinal lymphadenopathy measuring 1.6 cm in short axis. Subpleural reticulation with intralobular septal thickening, honeycombing, and traction bronchiectasis with craniol caudal gradient consistent with UIP. Air trapping noted on exhalation phase. No parenchymal nodule or opacity. No pleural effusion or thickening. No pericardial effusion.  CT CHEST W/O 12/24/15 (previously reviewed by me): Notable mediastinal lymphadenopathy with subcarinal lymph node measuring 1.47 m in short axis. No pleural effusion or thickening. Bilateral mid and lower lung zone predominant linear opacification with subpleural reticular changes consistent with fibrosis and evidence of traction bronchiectasis.  CARDIAC TTE (04/11/11): Mild LV dilation with EF 45-50 %.akinesis of anteroseptal & apical myocardium. Grade 1 diastolic dysfunction. LA mildly dilated & RA normal in size. RV normal in size and function. No aortic stenosis or regurgitation. Aortic root normal in size. Mild mitral regurgitation without stenosis. Trivial tricuspid regurgitation. Mild pulmonic regurgitation. No pericardial effusion.   LABS 11/29/16 LFT: 4.0/7.5/0.9/74/17/17  01/25/16 CRP: 0.1 ESR: 29 ANA: Negative Hypersensitivity Pneumonitis Panel:  Negative Anti-CCP:  <16 RF:  <14 SSA:  <1.0 SSB:  <1.0 SCL-70:  <1.0    Assessment & Plan:  81 y.o. male with underlying IPF. Patient's spirometry has significantly worsened and with worsening in his walk test and oxygen saturation with exertion he is demonstrating a significant decline in pulmonary function and acute on chronic hypoxic respiratory failure. He did have significant side effects from his previous dose of Ofev but is willing to try a decreased dose of the medication to see if there is some clinical benefit that he may achieve and hold further progression of his underlying interstitial lung disease/idiopathic pulmonary fibrosis. I am very concerned given the precipitous decline in his lung  function and feel that if this is ineffective he will continue to deteriorate. I instructed the patient to contact my office if he had any new breathing problems or questions before his next appointment. I also urged him to contact me if he had any problems with this new dose of Ofev.  1. ILD:  Checking hepatic function panel today. Ordering additional autoimmune workup given clinical worsening. Restarting patient on Ofev 171m bid. Repeat spirometry with DLCO next appointment. 2. Acute on chronic hypoxic respiratory failure: Checking serum CBC. Continuing on oxygen at 4 L/m given walk test today. 3. Health maintenance: Status post Prevnar 26 December 2015 & Pneumovax 05 November 2010. Administered Flu Vaccine today.  4. Follow-up: Return to clinic in 6 weeks.   JSonia BallerNAshok Cordia M.D. [facility] Inc[facility] & [facility] Care Pager:  [phone] 3pm or if no response, call [phone] 4:38 PM 02/08/17

## 2017-02-08 ENCOUNTER — Ambulatory Visit (INDEPENDENT_AMBULATORY_CARE_PROVIDER_SITE_OTHER): Payer: Medicare HMO | Admitting: *Deleted

## 2017-02-08 ENCOUNTER — Ambulatory Visit (INDEPENDENT_AMBULATORY_CARE_PROVIDER_SITE_OTHER): Payer: Medicare HMO | Admitting: Pulmonary Disease

## 2017-02-08 ENCOUNTER — Other Ambulatory Visit (INDEPENDENT_AMBULATORY_CARE_PROVIDER_SITE_OTHER): Payer: Medicare HMO

## 2017-02-08 ENCOUNTER — Encounter: Payer: Self-pay | Admitting: Pulmonary Disease

## 2017-02-08 VITALS — BP 112/60 | HR 93 | Ht 69.0 in | Wt 180.0 lb

## 2017-02-08 DIAGNOSIS — J9611 Chronic respiratory failure with hypoxia: Secondary | ICD-10-CM

## 2017-02-08 DIAGNOSIS — R0602 Shortness of breath: Secondary | ICD-10-CM

## 2017-02-08 DIAGNOSIS — Z23 Encounter for immunization: Secondary | ICD-10-CM

## 2017-02-08 DIAGNOSIS — J849 Interstitial pulmonary disease, unspecified: Secondary | ICD-10-CM | POA: Diagnosis not present

## 2017-02-08 LAB — PULMONARY FUNCTION TEST
DL/VA % pred: 67 %
DL/VA: 3.06 ml/min/mmHg/L
DLCO cor % pred: 28 %
DLCO cor: 8.97 ml/min/mmHg
DLCO unc % pred: 30 %
DLCO unc: 9.51 ml/min/mmHg
FEF 25-75 Pre: 2.38 L/s
FEF2575-%Pred-Pre: 123 %
FEV1-%Pred-Pre: 50 %
FEV1-Pre: 1.4 L
FEV1FVC-%Pred-Pre: 126 %
FEV6-%Pred-Pre: 41 %
FEV6-Pre: 1.53 L
FEV6FVC-%Pred-Pre: 106 %
FVC-%Pred-Pre: 39 %
FVC-Pre: 1.54 L
Pre FEV1/FVC ratio: 91 %
Pre FEV6/FVC Ratio: 100 %

## 2017-02-08 LAB — CBC WITH DIFFERENTIAL/PLATELET
BASOS ABS: 0.1 10*3/uL (ref 0.0–0.1)
BASOS PCT: 0.8 % (ref 0.0–3.0)
EOS ABS: 0.6 10*3/uL (ref 0.0–0.7)
Eosinophils Relative: 5.1 % — ABNORMAL HIGH (ref 0.0–5.0)
HCT: 49 % (ref 39.0–52.0)
Hemoglobin: 16.5 g/dL (ref 13.0–17.0)
LYMPHS ABS: 2.6 10*3/uL (ref 0.7–4.0)
Lymphocytes Relative: 22.7 % (ref 12.0–46.0)
MCHC: 33.6 g/dL (ref 30.0–36.0)
MCV: 92.3 fl (ref 78.0–100.0)
MONO ABS: 1 10*3/uL (ref 0.1–1.0)
Monocytes Relative: 8.7 % (ref 3.0–12.0)
NEUTROS ABS: 7.3 10*3/uL (ref 1.4–7.7)
NEUTROS PCT: 62.7 % (ref 43.0–77.0)
PLATELETS: 216 10*3/uL (ref 150.0–400.0)
RBC: 5.31 Mil/uL (ref 4.22–5.81)
RDW: 13.2 % (ref 11.5–15.5)
WBC: 11.7 10*3/uL — ABNORMAL HIGH (ref 4.0–10.5)

## 2017-02-08 MED ORDER — NINTEDANIB ESYLATE 100 MG PO CAPS: 100.0000 mg | ORAL_CAPSULE | Freq: Two times a day (BID) | ORAL | 11 refills | Status: DC

## 2017-02-08 NOTE — Progress Notes (Signed)
SIX MIN WALK 11/07/2016 02/29/2016  Medications Proventil at 7am, Coreg 12.5mg  and Losartan 25mg  at 7am.  Losartan 25mg  and Carvedilol 25mg  at 8am  Supplimental Oxygen during Test? (L/min) Yes No  O2 Flow Rate 2 -  Type Continuous -  Laps 7 8  Partial Lap (in Meters) 47 8  Baseline BP (sitting) 112/70 98/64  Baseline Heartrate 74 84  Baseline Dyspnea (Borg Scale) 0 0  Baseline Fatigue (Borg Scale) 0 0  Baseline SPO2 99 94  BP (sitting) 138/64 112/76  Heartrate 101 97  Dyspnea (Borg Scale) 5 1  Fatigue (Borg Scale) 0 0  SPO2 90 89  BP (sitting) 124/68 110/70  Heartrate 75 86  SPO2 100 94  Stopped or Paused before Six Minutes Yes No  Other Symptoms at end of Exercise Stopped at 4 laps with 37mins 59secs left due O2 dropping and staying around 87%. Placed on 2L of o2. Sats recovered to 94% on 2L and test was resumed.  -  Distance Completed [phone]  Tech Comments: Moderate pace. Pt denied any chest pain or dizziness.  Pt walked several laps without stopping and no complaints of SOB, no desat. Pt's sats hovered around 88%-90% breifly then quickly recovered to 94% on room air. Titration walk done for 6 minutes and patient did not desat. --amg

## 2017-02-08 NOTE — Progress Notes (Signed)
PFT done today. 

## 2017-02-08 NOTE — Patient Instructions (Addendum)
   Continue using your medication as prescribed.   We are going to put in for you to get the 100mg  dose of Ofev.  Call me if you have any problems with this new dose.  We will see you back in 6 weeks or sooner if needed.   TESTS ORDERED: 1. Spirometry with DLCO at next appointment 2. 6MWT with oxygen titration at next appointment 3. Serum CBC, Hepatic function panel, Myositis Panel, Smith Antibody, DS DNA Antibody, Centromere Antibody Screen, & Histone Antibody.

## 2017-02-09 LAB — HEPATIC FUNCTION PANEL
ALT: 18 U/L (ref 0–53)
AST: 20 U/L (ref 0–37)
Albumin: 4 g/dL (ref 3.5–5.2)
Alkaline Phosphatase: 62 U/L (ref 39–117)
BILIRUBIN DIRECT: 0 mg/dL (ref 0.0–0.3)
BILIRUBIN TOTAL: 0.6 mg/dL (ref 0.2–1.2)
Total Protein: 7.2 g/dL (ref 6.0–8.3)

## 2017-02-09 LAB — ANTI-SMITH ANTIBODY: ENA SM AB SER-ACNC: NEGATIVE AI

## 2017-02-09 LAB — CENTROMERE ANTIBODIES: Centromere Ab Screen: <1 AI

## 2017-02-12 ENCOUNTER — Telehealth: Payer: Self-pay | Admitting: Pulmonary Disease

## 2017-02-12 NOTE — Telephone Encounter (Signed)
Type Date User Summary Attachment  General 11/20/2016 4:58 PM Harland German - -  Note   Staff message confirmation [facility]        Type Date User Summary Attachment  General 11/20/2016 4:23 PM Harland German - -  Note   Pt is not happy with [employer] wants to switch companies. Staff message sent to [facility].        Type Date User Summary Attachment  General 11/07/2016 12:01 PM Harland German - -  Note   Order faxed to [facility] fax confirmation received         Type Date User Summary Attachment  Provider Comments 11/07/2016 11:33 AM Pinion, Waldemar Dickens, CMA Provider Comments -  Note   Route (nasal cannula OR mask): Nasal cannula Liter Flow: 2L with exertion Frequency (continuous with stationary and portable oxygen unit needed OR only at night): continuous portable oxygen concentrator Was this based off an ONO?: no DME: Pt does not currently have one Oxygen Conserving Device (Yes or No): yes Type of Oxygen (Gas or Liquid): Gas         Spoke with pt, he has a shoulder bag to carry his oxygen but he wants the tripod but he states he wants [place] to call the DME and see if they can provide this.

## 2017-02-13 LAB — HISTONE ANTIBODIES, IGG, BLOOD: Histone Ab: <1 U (ref <1.0)

## 2017-02-13 LAB — ANTI-DNA ANTIBODY, DOUBLE-STRANDED: DS DNA AB: 2 [IU]/mL

## 2017-02-13 NOTE — Telephone Encounter (Signed)
Will close this message since [facility] is taking care of the patient.

## 2017-02-13 NOTE — Telephone Encounter (Signed)
PCC's can we find out if the DME carries the tripod that this pt is asking about>   Thanks

## 2017-02-13 NOTE — Telephone Encounter (Signed)
Spoke to [facility] he stated they spoke to the patient yesterday and told him to come by there office and he can see what they have and change it out.

## 2017-02-14 ENCOUNTER — Telehealth: Payer: Self-pay | Admitting: Pulmonary Disease

## 2017-02-14 NOTE — Telephone Encounter (Signed)
rx was changed on 9/27 OV to 100mg  BID- see chart. Called and spoke with Maudie Mercury at [facility], who states that she will call the clinical team at [facility] to ensure that pt receives this rx.   Will hold message to follow up on with Maudie Mercury.

## 2017-02-16 NOTE — Telephone Encounter (Signed)
Spoke with Maudie Mercury at [facility], verified that the updated rx of 100mg  bid is currently being processed for delivery to pt.  Nothing further needed at this time.

## 2017-02-16 NOTE — Telephone Encounter (Signed)
[name] returned phone call; contact # [phone]

## 2017-02-16 NOTE — Telephone Encounter (Signed)
I have lmomtcb x 2 for Juan Mcdaniel to call to speak to her about the ofev that was changed at his last OV with JN.

## 2017-02-20 DIAGNOSIS — J479 Bronchiectasis, uncomplicated: Secondary | ICD-10-CM | POA: Diagnosis not present

## 2017-02-20 DIAGNOSIS — J9611 Chronic respiratory failure with hypoxia: Secondary | ICD-10-CM | POA: Diagnosis not present

## 2017-02-20 DIAGNOSIS — J849 Interstitial pulmonary disease, unspecified: Secondary | ICD-10-CM | POA: Diagnosis not present

## 2017-02-23 ENCOUNTER — Telehealth: Payer: Self-pay | Admitting: Pulmonary Disease

## 2017-02-23 NOTE — Telephone Encounter (Signed)
Called and spoke to pt. Pt states he has nearly run out of O2, he has his travel tank left which he came to the office with. Called [employer] and was advised they do not have an estimated time of restoration for pt's power but there was a crew that was assigned to the pt's area at 0900 today. Called [facility] and spoke with Janett Billow, she stated they could exchange pt's reserve tank at [facility] in [place]. Pt states he will get reserve tank and then come back to the office to sit with our O2 until we close then take his reserve tank back home. Reserve tank will last pt over 10 hours. Pt aware that if he is totally out of O2 and his power is not back on, to then go to ED as we are not opened on the weekend. Pt verbalized understanding.

## 2017-03-23 DIAGNOSIS — J849 Interstitial pulmonary disease, unspecified: Secondary | ICD-10-CM | POA: Diagnosis not present

## 2017-03-23 DIAGNOSIS — J9611 Chronic respiratory failure with hypoxia: Secondary | ICD-10-CM | POA: Diagnosis not present

## 2017-03-23 DIAGNOSIS — J479 Bronchiectasis, uncomplicated: Secondary | ICD-10-CM | POA: Diagnosis not present

## 2017-03-26 ENCOUNTER — Encounter: Payer: Self-pay | Admitting: Adult Health

## 2017-03-26 ENCOUNTER — Ambulatory Visit: Payer: Medicare HMO | Admitting: Adult Health

## 2017-03-26 ENCOUNTER — Ambulatory Visit (INDEPENDENT_AMBULATORY_CARE_PROVIDER_SITE_OTHER): Payer: Medicare HMO | Admitting: Pulmonary Disease

## 2017-03-26 ENCOUNTER — Ambulatory Visit (INDEPENDENT_AMBULATORY_CARE_PROVIDER_SITE_OTHER): Payer: Medicare HMO | Admitting: *Deleted

## 2017-03-26 DIAGNOSIS — J849 Interstitial pulmonary disease, unspecified: Secondary | ICD-10-CM

## 2017-03-26 DIAGNOSIS — J9611 Chronic respiratory failure with hypoxia: Secondary | ICD-10-CM

## 2017-03-26 LAB — PULMONARY FUNCTION TEST
DL/VA % pred: 62 %
DL/VA: 2.83 ml/min/mmHg/L
DLCO cor % pred: 24 %
DLCO cor: 7.55 ml/min/mmHg
DLCO unc % pred: 25 %
DLCO unc: 7.83 ml/min/mmHg
FEF 25-75 Pre: 2.11 L/sec
FEF2575-%PRED-PRE: 109 %
FEV1-%Pred-Pre: 47 %
FEV1-PRE: 1.32 L
FEV1FVC-%Pred-Pre: 128 %
FEV6-%PRED-PRE: 39 %
FEV6-PRE: 1.43 L
FEV6FVC-%Pred-Pre: 107 %
FVC-%PRED-PRE: 36 %
FVC-PRE: 1.43 L
Pre FEV1/FVC ratio: 93 %
Pre FEV6/FVC Ratio: 100 %

## 2017-03-26 MED ORDER — PREDNISONE 10 MG PO TABS: ORAL_TABLET | ORAL | 1 refills | Status: DC

## 2017-03-26 NOTE — Progress Notes (Signed)
SIX MIN WALK 03/26/2017 02/08/2017 11/07/2016 02/29/2016  Medications TYylenol 500 mg, Carvedilol 25mg , Losartan 25mg  coreg, tylenol, cozaar all taken at 8 am today Proventil at 7am, Coreg 12.5mg  and Losartan 25mg  at 7am.  Losartan 25mg  and Carvedilol 25mg  at 8am  Supplimental Oxygen during Test? (L/min) Yes Yes Yes No  O2 Flow Rate 4 2 2  -  Type Continuous Continuous Continuous -  Laps 4 6 7 8   Partial Lap (in Meters) 0 0 47 8  Baseline BP (sitting) 100/68 104/62 112/70 98/64  Baseline Heartrate 79 93 74 84  Baseline Dyspnea (Borg Scale) 3 7 0 0  Baseline Fatigue (Borg Scale) 0 7 0 0  Baseline SPO2 94 98 99 94  BP (sitting) 110/70 110/66 138/64 112/76  Heartrate 95 109 101 97  Dyspnea (Borg Scale) 3 4 5 1   Fatigue (Borg Scale) 0 4 0 0  SPO2 92 90 90 89  BP (sitting) 108/70 96/60 124/68 110/70  Heartrate 81 97 75 86  SPO2 99 99 100 94  Stopped or Paused before Six Minutes Yes Yes Yes No  Other Symptoms at end of Exercise oxygen levels dropped after 1.5 laps,(RA, 85%), (87% to 94% with 2L) (86%-92% with 3 L)(86 to 91% with 4L) ending at 92 % with 4L after 6 minutes stopped at 3:59 on the clock due to pt dropped to 88% on 2 liters, oxygen increased to 3 liters.    stopped again at 1:47 with oxygen at 88% and oxygen was increased to 4 liters.  Stopped at 4 laps with 50mins 59secs left due O2 dropping and staying around 87%. Placed on 2L of o2. Sats recovered to 94% on 2L and test was resumed.  -  Distance Completed [phone]  Tech Comments: TA/CMA pt did well during the walk.  only stopped to adjust the oxygen level due to his levels dropping.   Moderate pace. Pt denied any chest pain or dizziness.  Pt walked several laps without stopping and no complaints of SOB, no desat. Pt's sats hovered around 88%-90% breifly then quickly recovered to 94% on room air. Titration walk done for 6 minutes and patient did not desat. --amg

## 2017-03-26 NOTE — Assessment & Plan Note (Addendum)
Cont on O2 to keep sats >88-90%  

## 2017-03-26 NOTE — Assessment & Plan Note (Signed)
Appears to be gradually declining over last several months with increased O2 demands.  Workup for ILD etiology unrevealing with no obvious occupational exposures, medication side effects/expousre or CTD/Autoimmune.  Did not tolerate OFEV. Discussed Esbriet - worried about cost and side effects.  Will try steroid trial briefly to see if any clinical response. Will return in 4 weeks  Did discuss briefly EOL planning , palliative /hospice [facility] will - will discuss in more detail on return ov. -esp if sx continue to progressively decline.    Plan  Patient Instructions  Trial of Prednisone 10mg  4 tabs for 4 days , 3 tabs daily for 3 days , 2 tabs daily  And hold at this dose until seen back .  Continue on Oxygen 4l/m .  Follow up in 4 weeks and As needed   Please contact office for sooner follow up if symptoms do not improve or worsen or seek emergency care

## 2017-03-26 NOTE — Progress Notes (Signed)
@Patient  ID: Juan Mcdaniel, male    DOB: Mar 20, 1936, 81 y.o.   MRN: [id number]  Chief Complaint  Patient presents with  . Follow-up    ILD    Referring provider: Biagio Borg, MD  HPI: 81 year old male former smoker followed for ILD-UIP and chronic hypoxic respiratory failure on oxygen.   TEST  PFT 02/08/17: FVC 1.54 L (39%) FEV1 1.40 L (50%) FEV1/FVC 0.91 FEF 25 she -75 2.3 L (123%) DLCO corrected 28% 11/07/16: FVC 1.72 L (43%) FEV1 1.53 L (54%) FEV1/FVC 0.89 FEF 25-75 2.20 L (113%)                                                                                                          DLCO corrected 28% 02/08/16: FVC 2.02 L (51%) FEV1 1.77 L (62%) FEV1/FVC 0.88 FEF 25-75 2.64 L (133%) negative bronchodilator response TLC 3.48 L (50%) RV 55% ERV 59% DLCO corrected 30% (Hgb 15.8)  6MWT 02/08/17:  Walked 288 meters / Baseline Sat 98% on 2 L/m / Nadir Sat 88% at 4:33 on 3 L/m (required 4 L/m to maintain with exertino) 11/07/16:  Walked 383 meters / Baseline Sat 99% on RA / Nadir Sat 87% on RA @ 3:01 (required 2 L/m to maintain saturation with exertion) 02/29/16:  Walked 392 meters / Baseline Sat 94% on RA / Nadir Sat 89% on RA @ end of test  IMAGING HRCT CHEST W/O 11/13/16 (personally reviewed by me):  Subcarinal lymphadenopathy measuring 1.6 cm in short axis. Subpleural reticulation with intralobular septal thickening, honeycombing, and traction bronchiectasis with craniol caudal gradient consistent with UIP. Air trapping noted on exhalation phase. No parenchymal nodule or opacity. No pleural effusion or thickening. No pericardial effusion.  CT CHEST W/O 12/24/15 (previously reviewed by me): Notable mediastinal lymphadenopathy with subcarinal lymph node measuring 1.47 m in short axis. No pleural effusion or thickening. Bilateral mid and lower lung zone predominant linear opacification with subpleural reticular changes consistent with fibrosis and evidence of traction  bronchiectasis.  CARDIAC TTE (04/11/11): Mild LV dilation with EF 45-50 %.akinesis of anteroseptal & apical myocardium. Grade 1 diastolic dysfunction. LA mildly dilated & RA normal in size. RV normal in size and function. No aortic stenosis or regurgitation. Aortic root normal in size. Mild mitral regurgitation without stenosis. Trivial tricuspid regurgitation. Mild pulmonic regurgitation. No pericardial effusion.   LABS 11/29/16 LFT: 4.0/7.5/0.9/74/17/17  01/25/16 CRP: 0.1 ESR: 29 ANA: Negative Hypersensitivity Pneumonitis Panel:  Negative Anti-CCP:  <16 RF:  <14 SSA:  <1.0 SSB:  <1.0 SCL-70:  <1.0  03/26/2017 Follow up: ILD , O2 RF  Patient presents for a 60-monthfollow-up.  Patient has known IPF consistent with UIP on CT chest Pt is on OFEV , dose adjusted last ov due to side effects.  OFEV was decreased OFEV 1069mTwice daily  . He was not able to tolerate , due to nausea and headache. Has daily dry cough .  Breathing has been declining over last year. First noticed symptoms 2 years ago.  Labs for autoimmune/CTD last ov neg.  Remains on O2 at 4l/m . . O2 was increased last ov due to walk test showing increased oxygen demands to keep O2 saturations greater than 90%.  Patient required 4 L continuous flow.  He had previously been on 2 L.. No dyspnea at rest . Now gets winded with minimal activity   Patient had PFT done today.  That showed a slight decline in lung function.  FEV1 at 47%, ratio 93, FVC 36%.  This is down from previous PFT 6 weeks ago with FEV1 at 50% and FVC at 39%.  DLCO is also decreased at 25% versus previously at 30%. 6-minute walk test continues to decline.  Patient dropped from 288 m to 192 m.  Patient continues to require 4 L to keep O2 saturations greater than 90%.  Lives alone . Drives and does housework . Has to rest frequently due to dyspnea.     No Known Allergies  Immunization History  Administered Date(s) Administered  . Influenza Whole  02/12/2008, 03/02/2009, 01/20/2010  . Influenza, High Dose Seasonal PF 01/25/2016, 02/08/2017  . Influenza-Unspecified 03/01/2014  . Pneumococcal Conjugate-13 12/22/2015  . Pneumococcal Polysaccharide-23 06/12/2005, 10/21/2010  . Td 11/01/2004    Past Medical History:  Diagnosis Date  . ALLERGIC RHINITIS   . ANXIETY   . BENIGN PROSTATIC HYPERTROPHY, HX OF   . CARDIOMYOPATHY    EF 45% 2012 echo.    . CARPAL TUNNEL SYNDROME, LEFT   . DEGENERATIVE JOINT DISEASE, KNEES, BILATERAL   . DEPRESSION   . FATIGUE   . GLAUCOMA   . GLUCOSE INTOLERANCE   . HYPERCHOLESTEROLEMIA   . HYPERLIPIDEMIA   . MYOCARDIAL INFARCTION, HX OF    LAD occlusion treated with stent x 2 (HORIZON Study) 2006.  100% RCA and D2 managed medically.  40% circumflex stenosis.   . OSTEOARTHRITIS   . SINUSITIS- ACUTE-NOS   . URI     Tobacco History: Social History   Tobacco Use  Smoking Status Former Smoker  . Packs/day: 1.00  . Years: 16.00  . Pack years: 16.00  . Last attempt to quit: 05/16/1991  . Years since quitting: 25.8  Smokeless Tobacco Never Used  Tobacco Comment   smoked off and on for 16 years.    Counseling given: Not Answered Comment: smoked off and on for 16 years.    Outpatient Encounter Medications as of 03/26/2017  Medication Sig  . acetaminophen (TYLENOL) 500 MG tablet Take 500 mg by mouth daily.  Marland Kitchen aspirin 81 MG tablet Take 81 mg by mouth daily.    . carvedilol (COREG) 25 MG tablet TAKE 1 TABLET TWICE DAILY WITH A MEAL  . losartan (COZAAR) 25 MG tablet TAKE 1 TABLET EVERY DAY  . Misc. Devices (ACAPELLA) MISC Use as directed  . Nintedanib (OFEV) 100 MG CAPS Take 1 capsule (100 mg total) by mouth 2 (two) times daily.  Marland Kitchen NITROSTAT 0.4 MG SL tablet DISSOLVE ONE TABLET UNDER THE TONGUE EVERY 5 MINUTES AS NEEDED.  Marland Kitchen Respiratory Therapy Supplies (FLUTTER) DEVI Use as directed  . simvastatin (ZOCOR) 20 MG tablet TAKE 1 TABLET AT BEDTIME  . travoprost, benzalkonium, (TRAVATAN) 0.004 %  ophthalmic solution 1 drop at bedtime.    . VENTOLIN HFA 108 (90 Base) MCG/ACT inhaler INHALE 2 PUFFS EVERY 4 HOURS AS NEEDED FOR SHORTNESS OF BREATH  OR  WHEEZING  . predniSONE (DELTASONE) 10 MG tablet 4 tabs in AM x4 days, 3 tabs x3 days, 2 tabs daily and hold at this dose until next office  visit.   No facility-administered encounter medications on file as of 03/26/2017.      Review of Systems  Constitutional:   No  weight loss, night sweats,  Fevers, chills, + fatigue, or  lassitude.  HEENT:   No headaches,  Difficulty swallowing,  Tooth/dental problems, or  Sore throat,                No sneezing, itching, ear ache, nasal congestion, post nasal drip,   CV:  No chest pain,  Orthopnea, PND, swelling in lower extremities, anasarca, dizziness, palpitations, syncope.   GI  No heartburn, indigestion, abdominal pain, nausea, vomiting, diarrhea, change in bowel habits, loss of appetite, bloody stools.   Resp:  .  No chest wall deformity  Skin: no rash or lesions.  GU: no dysuria, change in color of urine, no urgency or frequency.  No flank pain, no hematuria   MS:  No joint pain or swelling.  No decreased range of motion.  No back pain.    Physical Exam  BP 132/68 (BP Location: Right Arm, Cuff Size: Normal)   Pulse 91   Ht 5' 9"  (1.753 m)   Wt 179 lb (81.2 kg)   SpO2 92%   BMI 26.43 kg/m   GEN: A/Ox3; pleasant , NAD, thin elderly on O2 , flat affect    HEENT:  [place]/AT,  EACs-clear, TMs-wnl, NOSE-clear, THROAT-clear, no lesions, no postnasal drip or exudate noted.   NECK:  Supple w/ fair ROM; no JVD; normal carotid impulses w/o bruits; no thyromegaly or nodules palpated; no lymphadenopathy.    RESP  BB crackles , no accessory muscle use, no dullness to percussion  CARD:  RRR, no m/r/g, no peripheral edema, pulses intact, no cyanosis or clubbing.  GI:   Soft & nt; nml bowel sounds; no organomegaly or masses detected.   Musco: Warm bil, no deformities or joint swelling  noted.   Neuro: alert, no focal deficits noted.    Skin: Warm, no lesions or rashes    Lab Results:  CBC  BNP No results found for: BNP  ProBNP No results found.   Assessment & Plan:   ILD (interstitial lung disease) ([place]) Appears to be gradually declining over last several months with increased O2 demands.  Workup for ILD etiology unrevealing with no obvious occupational exposures, medication side effects/expousre or CTD/Autoimmune.  Did not tolerate OFEV. Discussed Esbriet - worried about cost and side effects.  Will try steroid trial briefly to see if any clinical response. Will return in 4 weeks  Did discuss briefly EOL planning , palliative /hospice Al[facility] Hercules will - will discuss in more detail on return ov. -esp if sx continue to progressively decline.    Plan  Patient Instructions  Trial of Prednisone 44m 4 tabs for 4 days , 3 tabs daily for 3 days , 2 tabs daily  And hold at this dose until seen back .  Continue on Oxygen 4l/m .  Follow up in 4 weeks and As needed   Please contact office for sooner follow up if symptoms do not improve or worsen or seek emergency care       Chronic respiratory failure with hypoxia ([place] on O2 to keep sats >88-90%.      TRexene Edison NP 03/26/2017

## 2017-03-26 NOTE — Progress Notes (Signed)
PFT done today. 

## 2017-03-26 NOTE — Progress Notes (Signed)
Note and plan reviewed.  Sonia Baller Ashok Cordia, M.D. [facility] Pager:  [phone] After 7pm or if no response, call [phone] 5:05 PM 03/26/17

## 2017-03-26 NOTE — Patient Instructions (Addendum)
Trial of Prednisone 10mg  4 tabs for 4 days , 3 tabs daily for 3 days , 2 tabs daily  And hold at this dose until seen back .  Continue on Oxygen 4l/m .  Follow up in 4 weeks and As needed   Please contact office for sooner follow up if symptoms do not improve or worsen or seek emergency care

## 2017-04-09 DIAGNOSIS — H401132 Primary open-angle glaucoma, bilateral, moderate stage: Secondary | ICD-10-CM | POA: Diagnosis not present

## 2017-04-22 DIAGNOSIS — J479 Bronchiectasis, uncomplicated: Secondary | ICD-10-CM | POA: Diagnosis not present

## 2017-04-22 DIAGNOSIS — J9611 Chronic respiratory failure with hypoxia: Secondary | ICD-10-CM | POA: Diagnosis not present

## 2017-04-22 DIAGNOSIS — J849 Interstitial pulmonary disease, unspecified: Secondary | ICD-10-CM | POA: Diagnosis not present

## 2017-04-23 ENCOUNTER — Ambulatory Visit: Payer: Medicare HMO | Admitting: Adult Health

## 2017-04-30 ENCOUNTER — Encounter: Payer: Self-pay | Admitting: Adult Health

## 2017-04-30 ENCOUNTER — Ambulatory Visit: Payer: Medicare HMO | Admitting: Adult Health

## 2017-04-30 DIAGNOSIS — J849 Interstitial pulmonary disease, unspecified: Secondary | ICD-10-CM | POA: Diagnosis not present

## 2017-04-30 DIAGNOSIS — J9611 Chronic respiratory failure with hypoxia: Secondary | ICD-10-CM | POA: Diagnosis not present

## 2017-04-30 MED ORDER — PREDNISONE 10 MG PO TABS: ORAL_TABLET | ORAL | 0 refills | Status: DC

## 2017-04-30 NOTE — Assessment & Plan Note (Signed)
Cont on o2 , goal sat 88-90%.

## 2017-04-30 NOTE — Addendum Note (Signed)
Addended by: Parke Poisson E on: 04/30/2017 05:21 PM   Modules accepted: Orders

## 2017-04-30 NOTE — Progress Notes (Signed)
@Patient  ID: Juan Mcdaniel, male    DOB: 1936-01-31, 81 y.o.   MRN: [id number]  Chief Complaint  Patient presents with  . Follow-up    ILD     Referring provider: Biagio Borg, MD  HPI: HPI: 81 year old male former smoker followed for ILD-UIP and chronic hypoxic respiratory failure on oxygen. Truck driver   TEST  PFT 16/07/37: FVC 1.54 L (39%) FEV1 1.40 L (50%) FEV1/FVC 0.91 FEF 25 she -75 2.3 L (123%) DLCO corrected 28% 11/07/16: FVC 1.72 L (43%) FEV1 1.53 L (54%) FEV1/FVC 0.89 FEF 25-75 2.20 L (113%) DLCO corrected 28% 02/08/16: FVC 2.02 L (51%) FEV1 1.77 L (62%) FEV1/FVC 0.88 FEF 25-75 2.64 L (133%) negative bronchodilator response TLC 3.48 L (50%) RV 55% ERV 59% DLCO corrected 30% (Hgb 15.8)  6MWT 02/08/17: Walked 288 meters / Baseline Sat 98% on 2 L/m / Nadir Sat 88% at 4:33 on 3 L/m (required 4 L/m to maintain with exertino) 11/07/16: Walked 383 meters / Baseline Sat 99% on RA / Nadir Sat 87% on RA @ 3:01 (required 2 L/m to maintain saturation with exertion) 02/29/16: Walked 392 meters / Baseline Sat 94% on RA / Nadir Sat 89% on RA @ end of test  IMAGING HRCT CHEST W/O 7/2/18Subcarinal lymphadenopathy measuring 1.6 cm in short axis. Subpleural reticulation with intralobular septal thickening, honeycombing, and traction bronchiectasis with craniol caudal gradient consistent with UIP. Air trapping noted on exhalation phase. No parenchymal nodule or opacity. No pleural effusion or thickening. No pericardial effusion.  CT CHEST W/O 12/24/15 :Notable mediastinal lymphadenopathy with subcarinal lymph node measuring 1.47 m in short axis. No pleural effusion or thickening. Bilateral mid and lower lung zone predominant linear opacification with subpleural reticular changes consistent with fibrosis and evidence of traction bronchiectasis.  CARDIAC TTE  (04/11/11):Mild LV dilation with EF 45-50 %.akinesis of anteroseptal &apical myocardium. Grade 1 diastolic dysfunction. LA mildly dilated &RA normal in size. RV normal in size and function. No aortic stenosis or regurgitation. Aortic root normal in size. Mild mitral regurgitation without stenosis. Trivial tricuspid regurgitation. Mild pulmonic regurgitation. No pericardial effusion.   LABS 11/29/16 LFT: 4.0/7.5/0.9/74/17/17  01/25/16 CRP: 0.1 ESR: 29 ANA: Negative Hypersensitivity Pneumonitis Panel: Negative Anti-CCP: <16 RF: <14 SSA: <1.0 SSB: <1.0 SCL-70: <1.0   03/2017   FEV1 at 47%, ratio 93, FVC 36%.  This is down from previous PFT 6 weeks ago with FEV1 at 50% and FVC at 39%.  DLCO is also decreased at 25% versus previously at 30%. 6-minute walk test continues to decline.  Patient dropped from 288 m to 192 m.  Patient continues to require 4 L to keep O2 saturations greater than 90%.   04/30/2017 Follow up ; ILD /O2 RF  Pt returns for 1 month follow up . Appeared to be having progressive decline in IPF last ov with with increased dyspnea with activity .  cough and O2 demands. PFT showed a decline in lung fxn.  He was started on prednisone burst with slow taper. Currently on 70m daily.  Was not able to tolerate OFEV due GI side effects despite dose adjustment.  We discussed treatment options , he does not want to try Esbriet.  Declines pulmonary rehab.  Lives alone .  Open for hospice /palliative care if it gets to that point.  He is doing slightly better, back to baseline oxygen needs at 2l/m rest and 3l/m activity .  Still very sob with activity . No significant dyspnea at rest.  No Known Allergies  Immunization History  Administered Date(s) Administered  . Influenza Whole 02/12/2008, 03/02/2009, 01/20/2010  . Influenza, High Dose Seasonal PF 01/25/2016, 02/08/2017  . Influenza-Unspecified 03/01/2014  . Pneumococcal Conjugate-13 12/22/2015  .  Pneumococcal Polysaccharide-23 06/12/2005, 10/21/2010  . Td 11/01/2004    Past Medical History:  Diagnosis Date  . ALLERGIC RHINITIS   . ANXIETY   . BENIGN PROSTATIC HYPERTROPHY, HX OF   . CARDIOMYOPATHY    EF 45% 2012 echo.    . CARPAL TUNNEL SYNDROME, LEFT   . DEGENERATIVE JOINT DISEASE, KNEES, BILATERAL   . DEPRESSION   . FATIGUE   . GLAUCOMA   . GLUCOSE INTOLERANCE   . HYPERCHOLESTEROLEMIA   . HYPERLIPIDEMIA   . MYOCARDIAL INFARCTION, HX OF    LAD occlusion treated with stent x 2 (HORIZON Study) 2006.  100% RCA and D2 managed medically.  40% circumflex stenosis.   . OSTEOARTHRITIS   . SINUSITIS- ACUTE-NOS   . URI     Tobacco History: Social History   Tobacco Use  Smoking Status Former Smoker  . Packs/day: 1.00  . Years: 16.00  . Pack years: 16.00  . Last attempt to quit: 05/16/1991  . Years since quitting: 25.9  Smokeless Tobacco Never Used  Tobacco Comment   smoked off and on for 16 years.    Counseling given: Not Answered Comment: smoked off and on for 16 years.    Outpatient Encounter Medications as of 04/30/2017  Medication Sig  . acetaminophen (TYLENOL) 500 MG tablet Take 500 mg by mouth daily.  Marland Kitchen aspirin 81 MG tablet Take 81 mg by mouth daily.    . carvedilol (COREG) 25 MG tablet TAKE 1 TABLET TWICE DAILY WITH A MEAL  . losartan (COZAAR) 25 MG tablet TAKE 1 TABLET EVERY DAY  . Misc. Devices (ACAPELLA) MISC Use as directed  . NITROSTAT 0.4 MG SL tablet DISSOLVE ONE TABLET UNDER THE TONGUE EVERY 5 MINUTES AS NEEDED.  Marland Kitchen predniSONE (DELTASONE) 10 MG tablet Daily as directed  . Respiratory Therapy Supplies (FLUTTER) DEVI Use as directed  . simvastatin (ZOCOR) 20 MG tablet TAKE 1 TABLET AT BEDTIME  . travoprost, benzalkonium, (TRAVATAN) 0.004 % ophthalmic solution 1 drop at bedtime.    . VENTOLIN HFA 108 (90 Base) MCG/ACT inhaler INHALE 2 PUFFS EVERY 4 HOURS AS NEEDED FOR SHORTNESS OF BREATH  OR  WHEEZING  . [DISCONTINUED] predniSONE (DELTASONE) 10 MG tablet  4 tabs in AM x4 days, 3 tabs x3 days, 2 tabs daily and hold at this dose until next office visit.  . Nintedanib (OFEV) 100 MG CAPS Take 1 capsule (100 mg total) by mouth 2 (two) times daily. (Patient not taking: Reported on 04/30/2017)   No facility-administered encounter medications on file as of 04/30/2017.      Review of Systems   Constitutional:   No  weight loss, night sweats,  Fevers, chills, +fatigue, or  lassitude.  HEENT:   No headaches,  Difficulty swallowing,  Tooth/dental problems, or  Sore throat,                No sneezing, itching, ear ache, nasal congestion, post nasal drip,   CV:  No chest pain,  Orthopnea, PND, swelling in lower extremities, anasarca, dizziness, palpitations, syncope.   GI  No heartburn, indigestion, abdominal pain, nausea, vomiting, diarrhea, change in bowel habits, loss of appetite, bloody stools.   Resp: .  No excess mucus, no productive cough,  No non-productive cough,  No coughing up of  blood.  No change in color of mucus.  No wheezing.  No chest wall deformity  Skin: no rash or lesions.  GU: no dysuria, change in color of urine, no urgency or frequency.  No flank pain, no hematuria   MS:  No joint pain or swelling.  No decreased range of motion.  No back pain.    Physical Exam  BP 114/66 (BP Location: Left Arm, Cuff Size: Normal)   Pulse 83   SpO2 (!) 89%   GEN: A/Ox3; pleasant , NAD, elderly on O2    HEENT:  [place]/AT,  EACs-clear, TMs-wnl, NOSE-clear, THROAT-clear, no lesions, no postnasal drip or exudate noted.   NECK:  Supple w/ fair ROM; no JVD; normal carotid impulses w/o bruits; no thyromegaly or nodules palpated; no lymphadenopathy.    RESP  BB crackles , no accessory muscle use, no dullness to percussion  CARD:  RRR, no m/r/g, no peripheral edema, pulses intact, no cyanosis or clubbing.  GI:   Soft & nt; nml bowel sounds; no organomegaly or masses detected.   Musco: Warm bil, no deformities or joint swelling noted.   Neuro:  alert, no focal deficits noted.    Skin: Warm, no lesions or rashes    Lab Results:  CBC   BNP No results found for: BNP  ProBNP   Imaging: No results found.   Assessment & Plan:   ILD (interstitial lung disease) ([place]) Gradual decline in ILD with decrease in recent PFT . , intolerant to OFEV. Does not want to try Esbriet.  He has had slight improvement with prednisone , will slowly decrease with probable plans to d/c on return . Pt education on steroid use.   Plan  Patient Instructions  Continue on Oxygen 2l/m and 3l/m walking .  Decrease Prednisone 34m daily for 4 weeks then 561mdaily -hold at this dose.  Follow up with Dr. RaChase Callern 6 weeks and As needed        Chronic respiratory failure with hypoxia [place] , goal sat 88-90%.      TaRexene EdisonNP 04/30/2017

## 2017-04-30 NOTE — Patient Instructions (Signed)
Continue on Oxygen 2l/m and 3l/m walking .  Decrease Prednisone 10mg  daily for 4 weeks then 5mg  daily -hold at this dose.  Follow up with Dr. Chase Caller in 6 weeks and As needed

## 2017-04-30 NOTE — Assessment & Plan Note (Signed)
Gradual decline in ILD with decrease in recent PFT . , intolerant to OFEV. Does not want to try Esbriet.  He has had slight improvement with prednisone , will slowly decrease with probable plans to d/c on return . Pt education on steroid use.   Plan  Patient Instructions  Continue on Oxygen 2l/m and 3l/m walking .  Decrease Prednisone 10mg  daily for 4 weeks then 5mg  daily -hold at this dose.  Follow up with Dr. Chase Caller in 6 weeks and As needed

## 2017-05-23 DIAGNOSIS — J9611 Chronic respiratory failure with hypoxia: Secondary | ICD-10-CM | POA: Diagnosis not present

## 2017-05-23 DIAGNOSIS — J849 Interstitial pulmonary disease, unspecified: Secondary | ICD-10-CM | POA: Diagnosis not present

## 2017-05-23 DIAGNOSIS — J479 Bronchiectasis, uncomplicated: Secondary | ICD-10-CM | POA: Diagnosis not present

## 2017-05-30 NOTE — Progress Notes (Signed)
HPI The patient presents for follow up of CAD.  In 2012 he had a  [employer].  His EF to be 48% which was about his previous. He had a large inferior infarct consistent with his previous right coronary occlusion. There was some apical anteroseptal ischemia with mild reversibility.  In the absence of symptoms we managed this medically.  He returns for follow up.    Unfortunately he is quite limited by his chronic lung disease and is now on oxygen all the time.  He has desaturations with any activity including brushing his teeth.  He is actively followed by the pulmonologist. The patient denies any new symptoms such as chest discomfort, neck or arm discomfort. There has been no PND or orthopnea. There have been no reported palpitations, presyncope or syncope.    No Known Allergies  Current Outpatient Medications  Medication Sig Dispense Refill  . acetaminophen (TYLENOL) 500 MG tablet Take 500 mg by mouth daily.    Marland Kitchen aspirin 81 MG tablet Take 81 mg by mouth daily.      . carvedilol (COREG) 25 MG tablet TAKE 1 TABLET TWICE DAILY WITH A MEAL 180 tablet 3  . losartan (COZAAR) 25 MG tablet TAKE 1 TABLET EVERY DAY 90 tablet 3  . Misc. Devices (ACAPELLA) MISC Use as directed 1 each 0  . NITROSTAT 0.4 MG SL tablet DISSOLVE ONE TABLET UNDER THE TONGUE EVERY 5 MINUTES AS NEEDED. 25 tablet 10  . predniSONE (DELTASONE) 5 MG tablet Take 5 mg by mouth daily with breakfast.    . Respiratory Therapy Supplies (FLUTTER) DEVI Use as directed 1 each 0  . simvastatin (ZOCOR) 20 MG tablet TAKE 1 TABLET AT BEDTIME 90 tablet 3  . travoprost, benzalkonium, (TRAVATAN) 0.004 % ophthalmic solution 1 drop at bedtime.      . VENTOLIN HFA 108 (90 Base) MCG/ACT inhaler INHALE 2 PUFFS EVERY 4 HOURS AS NEEDED FOR SHORTNESS OF BREATH  OR  WHEEZING 54 g 1   No current facility-administered medications for this visit.     Past Medical History:  Diagnosis Date  . ALLERGIC RHINITIS   . ANXIETY   . BENIGN PROSTATIC  HYPERTROPHY, HX OF   . CARDIOMYOPATHY    EF 45% 2012 echo.    . CARPAL TUNNEL SYNDROME, LEFT   . DEGENERATIVE JOINT DISEASE, KNEES, BILATERAL   . DEPRESSION   . FATIGUE   . GLAUCOMA   . GLUCOSE INTOLERANCE   . HYPERCHOLESTEROLEMIA   . HYPERLIPIDEMIA   . MYOCARDIAL INFARCTION, HX OF    LAD occlusion treated with stent x 2 (HORIZON Study) 2006.  100% RCA and D2 managed medically.  40% circumflex stenosis.   . OSTEOARTHRITIS   . SINUSITIS- ACUTE-NOS   . URI     Past Surgical History:  Procedure Laterality Date  . CATARACT EXTRACTION Left   . TURP VAPORIZATION  05/15/85    ROS:   As stated in the HPI and negative for all other systems.  PHYSICAL EXAM BP 106/73   Pulse 90   Ht 5\' 9"  (1.753 m)   Wt 184 lb (83.5 kg)   BMI 27.17 kg/m   GENERAL:  Well appearing NECK:  No jugular venous distention, waveform within normal limits, carotid upstroke brisk and symmetric, no bruits, no thyromegaly LUNGS:  Bilateral diffuse fine crackles. CHEST:  Unremarkable HEART:  PMI not displaced or sustained,S1 and S2 within normal limits, no S3, no S4, no clicks, no rubs, no murmurs ABD:  Flat, positive bowel sounds normal in frequency in pitch, no bruits, no rebound, no guarding, no midline pulsatile mass, no hepatomegaly, no splenomegaly EXT:  2 plus pulses throughout, no edema, no cyanosis no clubbing   EKG:     Normal sinus rhythm, rate 90 , axis within normal limits, intervals within normal limits, old anteroseptal infarct.  This is unchanged from last year. 05/31/2017  Lab Results  Component Value Date   CHOL 128 04/27/2015   TRIG 89 04/27/2015   HDL 35 (L) 04/27/2015   LDLCALC 75 04/27/2015     ASSESSMENT AND PLAN    CORONARY ARTERY DISEASE -  The patient has no new sypmtoms.  No further cardiovascular testing is indicated.  We will continue with aggressive risk reduction and meds as listed.  HYPERTENSION -  The blood pressure is at target. No change in medications is indicated.  We will continue with therapeutic lifestyle changes (TLC).  HYPERLIPIDEMIA -  I will check a lipid profile.  He will continue meds as listed.

## 2017-05-31 ENCOUNTER — Encounter: Payer: Self-pay | Admitting: Cardiology

## 2017-05-31 ENCOUNTER — Ambulatory Visit: Payer: Medicare HMO | Admitting: Cardiology

## 2017-05-31 VITALS — BP 106/73 | HR 90 | Ht 69.0 in | Wt 184.0 lb

## 2017-05-31 DIAGNOSIS — E785 Hyperlipidemia, unspecified: Secondary | ICD-10-CM | POA: Diagnosis not present

## 2017-05-31 DIAGNOSIS — I252 Old myocardial infarction: Secondary | ICD-10-CM

## 2017-05-31 DIAGNOSIS — I10 Essential (primary) hypertension: Secondary | ICD-10-CM

## 2017-05-31 NOTE — Patient Instructions (Addendum)
Medication Instructions:  Continue current medications  If you need a refill on your cardiac medications before your next appointment, please call your pharmacy.  Labwork: Fasting Lipids Liver at PCP  Testing/Procedures: None Ordered   Follow-Up: Your physician wants you to follow-up in: 1 Year. You should receive a reminder letter in the mail two months in advance. If you do not receive a letter, please call our office [phone].    Thank you for choosing [facility] at [facility]!!

## 2017-06-01 ENCOUNTER — Telehealth: Payer: Self-pay | Admitting: Adult Health

## 2017-06-01 DIAGNOSIS — J849 Interstitial pulmonary disease, unspecified: Secondary | ICD-10-CM

## 2017-06-01 NOTE — Telephone Encounter (Signed)
Spoke with pt. States that he received an oxymizer cannula yesterday from [facility]. Pt would prefer to have a pendent oxymizer. Order has been placed for a pendent oxymizer to go to [facility]. Nothing further was needed.

## 2017-06-18 ENCOUNTER — Ambulatory Visit: Payer: Medicare HMO | Admitting: Internal Medicine

## 2017-06-18 ENCOUNTER — Other Ambulatory Visit: Payer: Medicare HMO

## 2017-06-18 ENCOUNTER — Encounter: Payer: Self-pay | Admitting: Internal Medicine

## 2017-06-18 VITALS — BP 122/70 | HR 89 | Ht 69.0 in | Wt 180.0 lb

## 2017-06-18 DIAGNOSIS — J9611 Chronic respiratory failure with hypoxia: Secondary | ICD-10-CM | POA: Diagnosis not present

## 2017-06-18 DIAGNOSIS — J84112 Idiopathic pulmonary fibrosis: Secondary | ICD-10-CM | POA: Diagnosis not present

## 2017-06-18 NOTE — Patient Instructions (Addendum)
ICD-10-CM   1. IPF (idiopathic pulmonary fibrosis) ([place]) J84.112   2. Chronic respiratory failure with hypoxia (HCC) J96.11    Disease is progressive 25% decline sept 2017 -> nov 2018; this I agree is a very poor sign for future outlool  Plan - Respect decision for supportive care without antifibrotics - respect desire not to participate in research trials - list ofev as allergy - gi side effects  - continue o2 2L at rest, 4L exertion - refer [facility] for home hospice support and goals of care for remainder of natural life  Followup  - 3 months or sooner if needed

## 2017-06-18 NOTE — Progress Notes (Signed)
Subjective:     Patient ID: Juan Mcdaniel, male   DOB: 08-05-1935, 82 y.o.   MRN: [id number]  HPI  Chief Complaint  Patient presents with  . 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  PFT 76/19/50: FVC 1.54 L (39%) FEV1 1.40 L (50%) FEV1/FVC 0.91 FEF 25 she -75 2.3 L (123%) DLCO corrected 28% 11/07/16: FVC 1.72 L (43%) FEV1 1.53 L (54%) FEV1/FVC 0.89 FEF 25-75 2.20 L (113%) DLCO corrected 28% 02/08/16: FVC 2.02 L (51%) FEV1 1.77 L (62%) FEV1/FVC 0.88 FEF 25-75 2.64 L (133%) negative bronchodilator response TLC 3.48 L (50%) RV 55% ERV 59% DLCO corrected 30% (Hgb 15.8)  6MWT 02/08/17: Walked 288 meters / Baseline Sat 98% on 2 L/m / Nadir Sat 88% at 4:33 on 3 L/m (required 4 L/m to maintain with exertino) 11/07/16: Walked 383 meters / Baseline Sat 99% on RA / Nadir Sat 87% on RA @ 3:01 (required 2 L/m to maintain saturation with exertion) 02/29/16: Walked 392 meters / Baseline Sat 94% on RA / Nadir Sat 89% on RA @ end of test  IMAGING HRCT CHEST W/O 7/2/18Subcarinal lymphadenopathy measuring 1.6 cm in short axis. Subpleural reticulation with intralobular septal thickening, honeycombing, and traction bronchiectasis with craniol caudal gradient consistent with UIP. Air trapping noted on exhalation phase. No parenchymal nodule or opacity. No pleural effusion or thickening. No pericardial effusion.  CT CHEST W/O 12/24/15 :Notable mediastinal lymphadenopathy with subcarinal lymph node measuring 1.47 m in short axis. No pleural effusion or thickening. Bilateral mid and lower lung zone predominant linear opacification with subpleural reticular changes consistent with fibrosis and evidence of traction  bronchiectasis.  CARDIAC TTE (04/11/11):Mild LV dilation with EF 45-50 %.akinesis of anteroseptal &apical myocardium. Grade 1 diastolic dysfunction. LA mildly dilated &RA normal in size. RV normal in size and function. No aortic stenosis or regurgitation. Aortic root normal in size. Mild mitral regurgitation without stenosis. Trivial tricuspid regurgitation. Mild pulmonic regurgitation. No pericardial effusion.   LABS 11/29/16 LFT: 4.0/7.5/0.9/74/17/17  01/25/16 CRP: 0.1 ESR: 29 ANA: Negative Hypersensitivity Pneumonitis Panel: Negative Anti-CCP: <16 RF: <14 SSA: <1.0 SSB: <1.0 SCL-70: <1.0   03/2017   FEV1 at 47%, ratio 93, FVC 36%.  This is down from previous PFT 6 weeks ago with FEV1 at 50% and FVC at 39%.  DLCO is also decreased at 25% versus previously at 30%. 6-minute walk test continues to decline.  Patient dropped from 288 m to 192 m.  Patient continues to require 4 L to keep O2 saturations greater than 90%.   04/30/2017 Follow up ; ILD /O2 RF  Pt returns for 1 month follow up . Appeared to be having progressive decline in IPF last ov with with increased dyspnea with activity .  cough and O2 demands. PFT showed a decline in lung fxn.  He was started on prednisone burst with slow taper. Currently on 77m daily.  Was not able to tolerate OFEV due GI side effects despite dose adjustment.  We discussed treatment options , he does not want to try Esbriet.  Declines pulmonary rehab.  Lives alone .  Open for hospice /palliative care if it gets to that point.  He is doing slightly better, back to baseline oxygen needs at 2l/m rest and 3l/m activity .  Still very sob with activity . No significant  dyspnea at rest.    OV 06/18/2017  Chief Complaint  Patient presents with  . Follow-up    Pt states he is now becoming SOB easier, occ. cough with clear to occ green mucus. Pt denies any CP. DME: [facility], 3L O2   Follow-up idiopathic pulmonary fibrosis  I am meeting him  for the first time.  He tells me that he was diagnosed approximately 2017.  Sometime in 2018 he did try nintedanib for a week and caused significant GI side effects.  At this point in time he says he lives alone.  He says that even basic activities such as changing clothes are becoming extremely difficult.  He is finding it very difficult to even make it to office appointments.  He is on 2 L of oxygen at rest and 4 L with exertion.  He is reporting progressive decline in dyspnea.  Last seen April 30, 2017 in our office and since then worsening dyspnea.  Review of pulmonary function test shows 25% decline in Juan Mcdaniel and a matter of 14 months between September 2017 and November 2018.  There is a marker of extremely poor prognosis.  We discussed referral to research trials but he is not interested.  We discussed referral to trying the other anti-fibrotic pirfenidone but he does not want to be bothered by it.  He does not want to be bothered by pulmonary fibrosis foundation support group either.  He says his life is content.  He lives alone.  He prefers to avoid going in a nursing home and waste his money.  He wants to die at home.  So is interested in hospice care even now.  He is reporting class III-4 dyspnea      Results for Juan Mcdaniel, Juan Mcdaniel (MRN [id number]) as of 06/18/2017 12:11  Ref. Range 02/08/2016 08:58 11/07/2016 08:44 02/08/2017 14:24 03/26/2017 14:07  FVC-Pre Latest Units: L 2.02 1.72 1.54 1.43  FVC-%Pred-Pre Latest Units: % 51 43 39 36   Results for Juan Mcdaniel, Juan Mcdaniel (MRN [id number]) as of 06/18/2017 12:11  Ref. Range 02/08/2016 08:58 11/07/2016 08:44 02/08/2017 14:24 03/26/2017 14:07  DLCO unc Latest Units: ml/min/mmHg 9.80 9.50 9.51 7.83  DLCO unc % pred Latest Units: % 31 30 30 25      has a past medical history of ALLERGIC RHINITIS, ANXIETY, BENIGN PROSTATIC HYPERTROPHY, HX OF, CARDIOMYOPATHY, CARPAL TUNNEL SYNDROME, LEFT, DEGENERATIVE JOINT DISEASE, KNEES, BILATERAL, DEPRESSION, FATIGUE,  GLAUCOMA, GLUCOSE INTOLERANCE, HYPERCHOLESTEROLEMIA, HYPERLIPIDEMIA, MYOCARDIAL INFARCTION, HX OF, OSTEOARTHRITIS, SINUSITIS- ACUTE-NOS, and URI.   reports that he quit smoking about 26 years ago. He has a 16.00 pack-year smoking history. he has never used smokeless tobacco.  Past Surgical History:  Procedure Laterality Date  . CATARACT EXTRACTION Left   . TURP VAPORIZATION  05/15/85    No Known Allergies  Immunization History  Administered Date(s) Administered  . Influenza Whole 02/12/2008, 03/02/2009, 01/20/2010  . Influenza, High Dose Seasonal PF 01/25/2016, 02/08/2017  . Influenza-Unspecified 03/01/2014  . Pneumococcal Conjugate-13 12/22/2015  . Pneumococcal Polysaccharide-23 06/12/2005, 10/21/2010  . Td 11/01/2004    Family History  Problem Relation Age of Onset  . Stroke Mother   . Heart attack Father   . Cirrhosis Brother        alcohol induced  . Mesothelioma [place]   . Diabetes Brother   . Stroke Brother   . Rheum arthritis Other   . Lung disease Neg Hx      Current Outpatient Medications:  .  acetaminophen (TYLENOL) 500 MG tablet, Take 500  mg by mouth daily., Disp: , Rfl:  .  aspirin 81 MG tablet, Take 81 mg by mouth daily.  , Disp: , Rfl:  .  carvedilol (COREG) 25 MG tablet, TAKE 1 TABLET TWICE DAILY WITH A MEAL, Disp: 180 tablet, Rfl: 3 .  losartan (COZAAR) 25 MG tablet, TAKE 1 TABLET EVERY DAY, Disp: 90 tablet, Rfl: 3 .  Misc. Devices (ACAPELLA) MISC, Use as directed, Disp: 1 each, Rfl: 0 .  predniSONE (DELTASONE) 5 MG tablet, Take 5 mg by mouth daily with breakfast., Disp: , Rfl:  .  Respiratory Therapy Supplies (FLUTTER) DEVI, Use as directed, Disp: 1 each, Rfl: 0 .  simvastatin (ZOCOR) 20 MG tablet, TAKE 1 TABLET AT BEDTIME, Disp: 90 tablet, Rfl: 3 .  travoprost, benzalkonium, (TRAVATAN) 0.004 % ophthalmic solution, 1 drop at bedtime.  , Disp: , Rfl:  .  VENTOLIN HFA 108 (90 Base) MCG/ACT inhaler, INHALE 2 PUFFS EVERY 4 HOURS AS NEEDED FOR SHORTNESS OF  BREATH  OR  WHEEZING, Disp: 54 g, Rfl: 1 .  NITROSTAT 0.4 MG SL tablet, DISSOLVE ONE TABLET UNDER THE TONGUE EVERY 5 MINUTES AS NEEDED. (Patient not taking: Reported on 06/18/2017), Disp: 25 tablet, Rfl: 10   Review of Systems     Objective:   Physical Exam Vitals:   06/18/17 1134  BP: 122/70  Pulse: 89  SpO2: 90%  Weight: 180 lb (81.6 kg)  Height: 5' 9"  (1.753 m)    Estimated body mass index is 26.58 kg/m as calculated from the following:   Height as of this encounter: 5' 9"  (1.753 m).   Weight as of this encounter: 180 lb (81.6 kg).     Assessment:       ICD-10-CM   1. IPF (idiopathic pulmonary fibrosis) ([place]) J84.112   2. Chronic respiratory failure with hypoxia (HCC) J96.11    Discussed hospice Discussed medicare hospice benefit  - a medicare paid benefit  - for people with terminal qualifying  illness such as IPF, COPD, cancer with statistical prognosis < 6 months for which there is no cure - utilization of hospice shows people live longer paradoxically than those without hospice due to improved attention  - explained hospice locations - home, residential etc.,   - explained respite care options for caregivers  - explained that she could still get treatment for non-hospice diagnosis and still come to office to see me for hospice related diagnosis that i provide support for  - explained that hospice provides nursing, MD, chaplain, volunteer and medications and supplies paid through medicare     Plan:      Disease is progressive 25% decline sept 2017 -> nov 2018; this I agree is a very poor sign for future outlool  Plan - Respect decision for supportive care without antifibrotics - respect desire not to participate in research trials - list ofev as allergy - gi side effects  - continue o2 2L at rest, 4L exertion - refer [facility] for home hospice support and goals of care for remainder of natural life  Followup  - 3 months or sooner if needed   > 50% of this > 25  min visit spent in face to face counseling or coordination of care    Dr. Brand Males, M.D., [facility].C.P Pulmonary and Critical Care Medicine Staff Physician, [place] Director - Interstitial Lung Disease  Program  Pulmonary [place] at [place], [place], [address]  Pager: [phone], If no answer  or between  15:00h - 7:00h: call [phone] Telephone: [phone]

## 2017-06-21 ENCOUNTER — Telehealth: Payer: Self-pay | Admitting: Internal Medicine

## 2017-06-21 MED ORDER — MORPHINE SULFATE 20 MG/5ML PO SOLN: 5.0000 mg | Freq: Two times a day (BID) | ORAL | 0 refills | Status: DC | PRN

## 2017-06-21 MED ORDER — MORPHINE SULFATE 20 MG/5ML PO SOLN: ORAL | 0 refills | Status: DC

## 2017-06-21 NOTE — Telephone Encounter (Signed)
MR please verify quantity to be called into pharmacy

## 2017-06-21 NOTE — Telephone Encounter (Signed)
Per MR verbally- 5mg  morphine bid prn, as 4mg  is not available.  Lm for [facility] with Hospice to make aware.  Called pt and verified pharmacy. Rx for Morphine has been faxed to [facility]. Nothing further is needed.

## 2017-06-21 NOTE — Telephone Encounter (Signed)
I will hve to sign because opioid. I am in my office in level 2 till 16.40 06/21/2017 . This is for palliative relief of dyspnea for end stage fibrosis of lung IPF. 30 day supply  Dr. Brand Males, M.D., [facility].C.P Pulmonary and Critical Care Medicine Staff Physician, [place] Director - Interstitial Lung Disease  Program  Pulmonary [place] at [place], [place], [address]  Pager: [phone], If no answer or between  15:00h - 7:00h: call [phone] Telephone: [phone]

## 2017-06-21 NOTE — Telephone Encounter (Signed)
I recommend morphine sulfate prn for dyspnea. Recommend 4mg  morphine 2 times daily as needed syrup  Dr. Brand Males, M.D., [facility].C.P Pulmonary and Critical Care Medicine Staff Physician, [place] Director - Interstitial Lung Disease  Program  Pulmonary [place] at [place], [place], [address]  Pager: [phone], If no answer or between  15:00h - 7:00h: call [phone] Telephone: [phone]

## 2017-06-21 NOTE — Telephone Encounter (Signed)
MR please advise hospice nurse advised she felt like the patient was having increased anxiety with shortness of breath. Nurse is requesting we send something in. [facility]

## 2017-07-02 ENCOUNTER — Telehealth: Payer: Self-pay | Admitting: Internal Medicine

## 2017-07-02 NOTE — Telephone Encounter (Signed)
Spoke with Maura at [facility], states that pt is requesting a refill on cough syrup Mucinex Fast Max (guaifenesin, dextromethorphan, phenylepherin).  [name] states that this is not covered on Hospice formulary but if we write a rx for it they can dispense it for him.  Per [name] this had previously been written by JN. Pharmacy: [facility].  MR please advise if ok to write rx.  Thanks!

## 2017-07-02 NOTE — Telephone Encounter (Signed)
Left message for Maura to call back.  

## 2017-07-02 NOTE — Telephone Encounter (Signed)
That is fine. Dosing and frequency per patient Juan Mcdaniel and I will sign  Dr. Brand Males, M.D., [facility].C.P Pulmonary and Critical Care Medicine Staff Physician, [place] Director - Interstitial Lung Disease  Program  Pulmonary [place] at [place], [place], [address]  Pager: [phone], If no answer or between  15:00h - 7:00h: call [phone] Telephone: [phone]

## 2017-07-03 ENCOUNTER — Other Ambulatory Visit: Payer: Self-pay | Admitting: Adult Health

## 2017-07-03 NOTE — Telephone Encounter (Signed)
lmtcb x2 for Maura.

## 2017-07-15 IMAGING — DX DG CHEST 2V
2 series · 2 of 2 positions shown · non-contrast
Comparison: 12/05/2005 and 05/01/2005

CLINICAL DATA: Wheezing with cough for 2 months.

EXAM:
CHEST  2 VIEW

[chest pa]
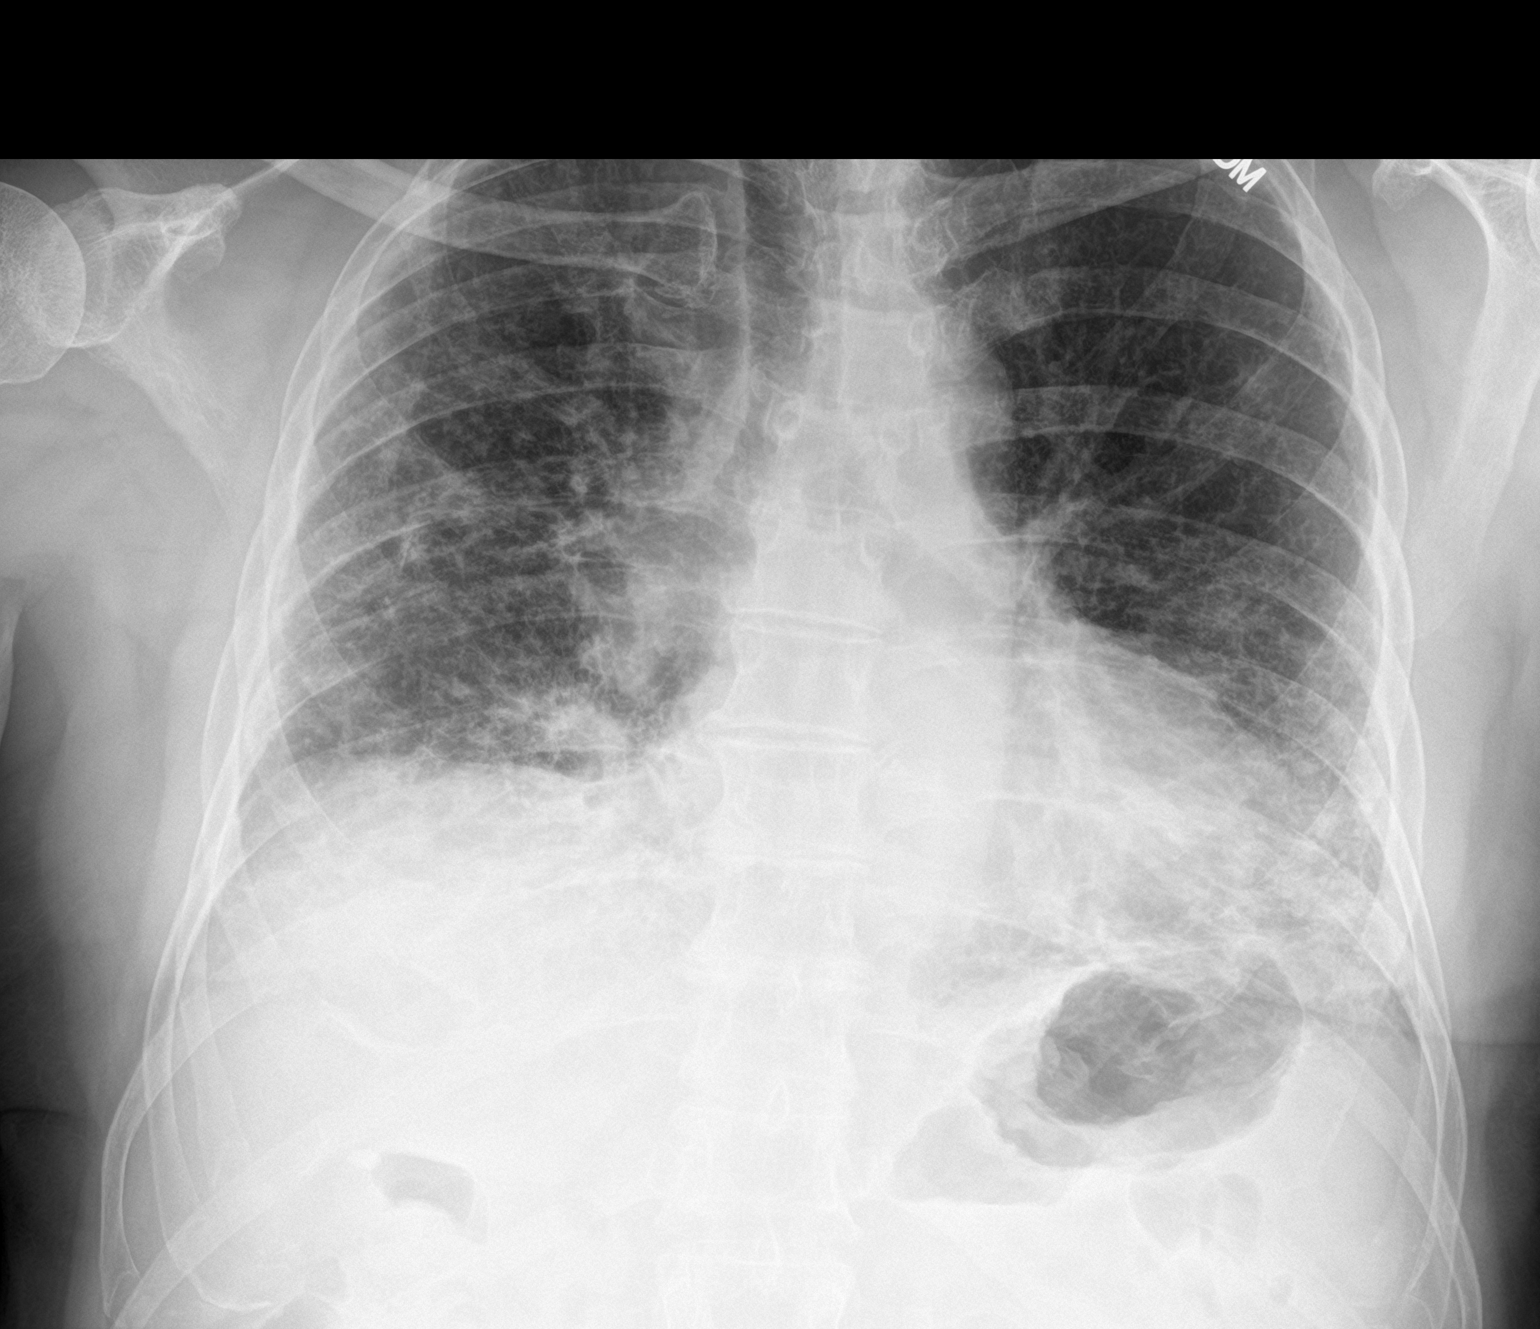

[chest lat]
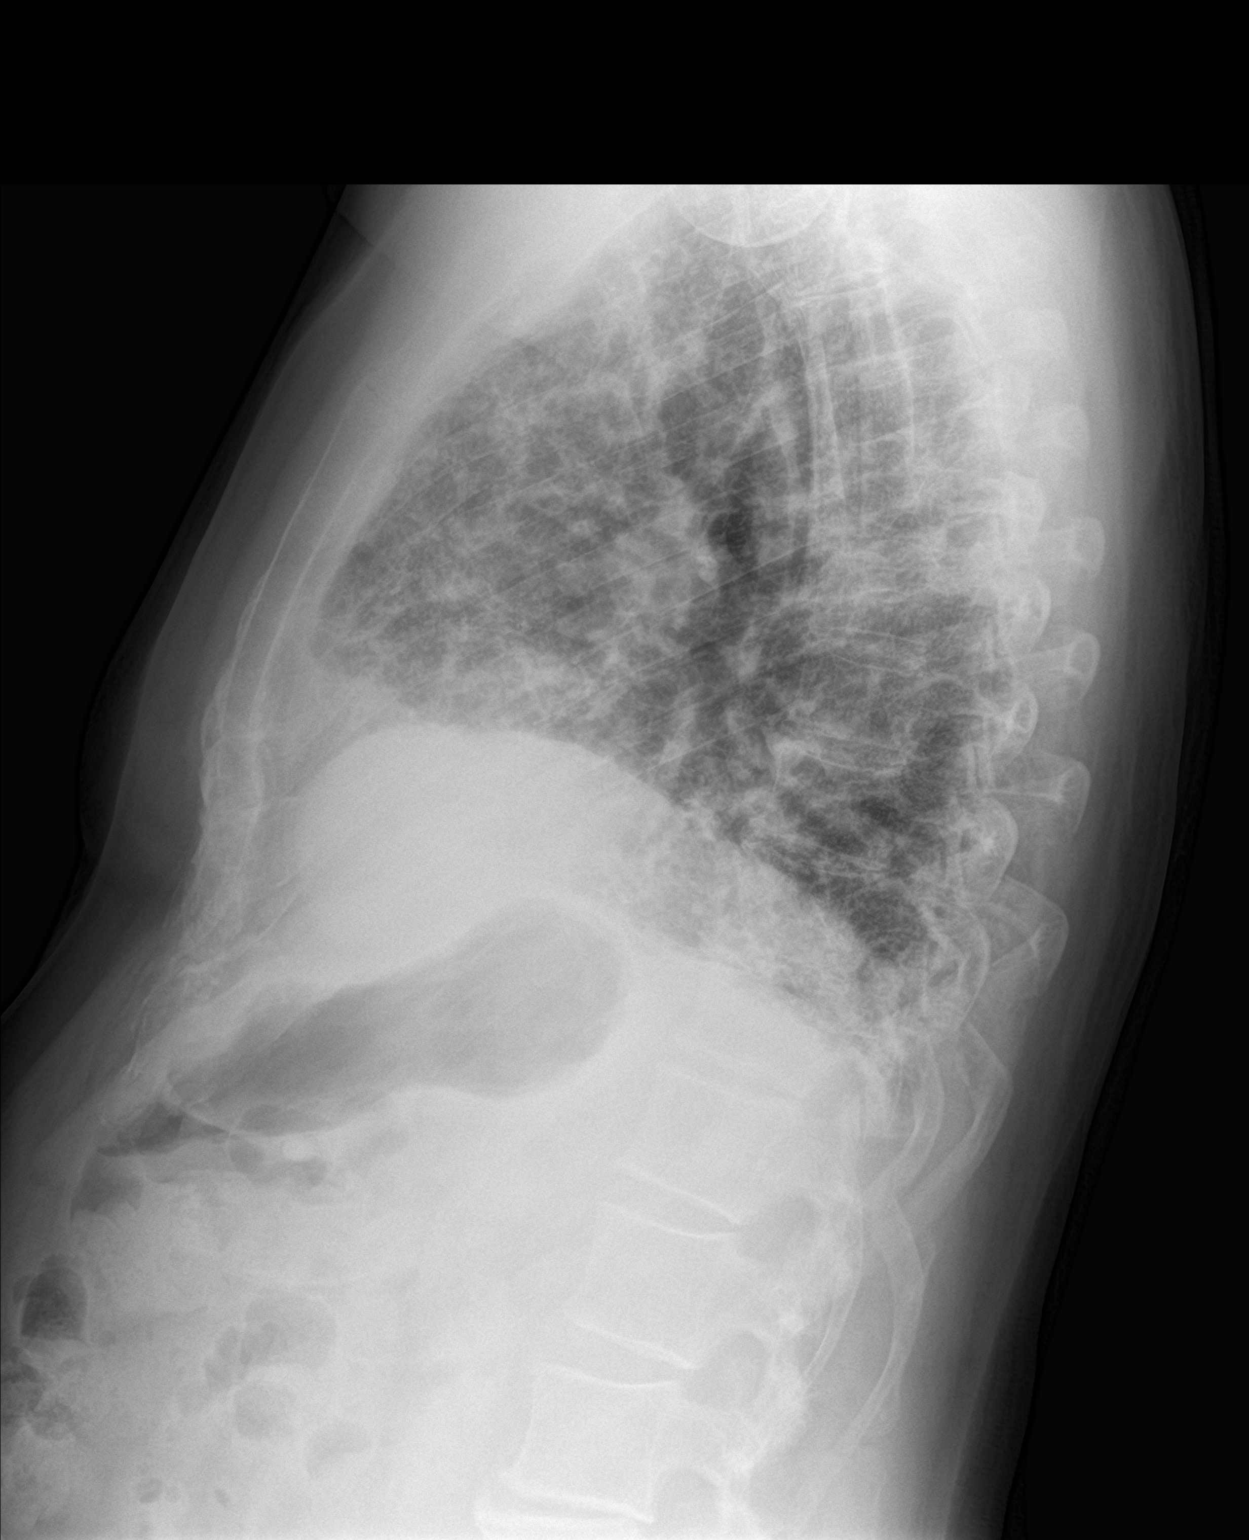

[2 of 2 positions shown; findings below may reference images not displayed]

FINDINGS: Patchy densities throughout both lungs, most prominent at the left
lung base. Mild elevation of the right hemidiaphragm. Heart and
mediastinum are within normal limits. No large pleural effusions. No
acute bone abnormality.
IMPRESSION: Patchy parenchymal densities in both lungs. Asymmetric distribution
of these densities. Findings are nonspecific. Findings could
represent an atypical infectious etiology. Consider further
evaluation with a chest CT.

## 2017-07-24 ENCOUNTER — Telehealth: Payer: Self-pay | Admitting: Internal Medicine

## 2017-07-24 NOTE — Telephone Encounter (Signed)
Left message for Juan Mcdaniel to call back.  

## 2017-07-24 NOTE — Telephone Encounter (Signed)
disesae is progresive  Plan  - increase o2 to even 4 or 6L [place] - accept pulse ox > 85% as long as he feels ok  - cotniue morphione - do prednisone Please take Take prednisone 40mg  once daily x 3 days, then 30mg  once daily x 3 days, then 20mg  once daily x 3 days, then prednisone 10mg  once daily  x 3 days and then 5mg  daily to continue  - do duoneb q4h prn  - increase goals of care and support at home   Dr. Brand Males, M.D., [facility].C.P Pulmonary and Critical Care Medicine Staff Physician, [place] Director - Interstitial Lung Disease  Program  Pulmonary [place] at [place], [place], [address]  Pager: [phone], If no answer or between  15:00h - 7:00h: call [phone] Telephone: [phone]

## 2017-07-24 NOTE — Telephone Encounter (Signed)
Spoke with pt, he has increased exertional dyspnea  And orthopnea. He desat to 77% while making his bed with 2.5 L pf O2 and he is  Trying his best to conserve energy.Marland Kitchen He was given Morphine 10 mg dose every 4 hours and this was increased already to help with dyspnea. She is hearing coarse crackles throughout his lungs with wheezing in right upper lobe. She notices he does not have a nebulizer and wants to see what MR thinks about him doing this.  She wanted to see if we can increase dose of Prednisone from 5 mg as well to help with dyspnea. He is technically home bound and they have someone going out to get his groceries because he is so SOB. MR please advise if we can do the things Juan Mcdaniel suggested.   [employer]

## 2017-07-25 ENCOUNTER — Telehealth: Payer: Self-pay | Admitting: Internal Medicine

## 2017-07-25 MED ORDER — IPRATROPIUM-ALBUTEROL 0.5-2.5 (3) MG/3ML IN SOLN: 3.0000 mL | RESPIRATORY_TRACT | 5 refills | Status: AC | PRN

## 2017-07-25 MED ORDER — PREDNISONE 10 MG PO TABS: ORAL_TABLET | ORAL | 0 refills | Status: AC

## 2017-07-25 NOTE — Telephone Encounter (Signed)
Maura from Hospice returning call. Cb is [phone].

## 2017-07-25 NOTE — Telephone Encounter (Signed)
Spoke with Maura with Hospice. She is aware of Dr. Golden Pop recommendations. Rxs for prednisone and Duoneb have been sent in. Nothing further was needed.

## 2017-07-25 NOTE — Telephone Encounter (Signed)
Left message for patient to call back  

## 2017-07-26 NOTE — Telephone Encounter (Signed)
Called and spoke with patient, he states that TP placed him on a prednisone taper a few months back and he had finished that. Patient states that recently the other week MR placed him on another prednisone taper. Patient is wanting to let TP know this just so she is aware. I advised patient I would send the message to TP as an FYI. Nothing further needed.

## 2017-07-26 NOTE — Telephone Encounter (Signed)
Pt is returning call. Cb is [phone]

## 2017-07-26 NOTE — Telephone Encounter (Signed)
Okay thanks for update. Hope he is feeling better.

## 2017-08-28 ENCOUNTER — Telehealth: Payer: Self-pay | Admitting: Internal Medicine

## 2017-08-28 NOTE — Telephone Encounter (Signed)
Called and spoke with patient, he states that he wanted a new flutter valve since he hasnt had a new one in a few months. I advised patient that we dont give new flutter valves every few months because insurance wont cover that unless it is broke. Patients states nothing was wrong with it. Nothing further needed.

## 2017-09-11 ENCOUNTER — Ambulatory Visit: Payer: Medicare HMO | Admitting: Internal Medicine

## 2017-09-11 ENCOUNTER — Telehealth: Payer: Self-pay | Admitting: Internal Medicine

## 2017-09-11 ENCOUNTER — Ambulatory Visit: Payer: Medicare HMO | Admitting: Adult Health

## 2017-09-11 NOTE — Telephone Encounter (Signed)
Spoke with pt, he is being re-certified for his next hospice eligibility period which begins on May 6 and Maura needs verbal order to start care. MR are you ok with verbal? Please advise.    Patient Instructions by Brand Males, MD at 06/18/2017 11:45 AM  Author: Brand Males, MD Author Type: Physician Filed: 06/18/2017 12:28 PM  Note Status: Addendum Mickle Mallory: Cosign Not Required Encounter Date: 06/18/2017  Editor: Brand Males, MD (Physician)  Prior Versions: 1. Brand Males, MD (Physician) at 06/18/2017 12:24 PM - Addendum   2. Brand Males, MD (Physician) at 06/18/2017 12:24 PM - Signed      ICD-10-CM   1. IPF (idiopathic pulmonary fibrosis) ([place]) J84.112   2. Chronic respiratory failure with hypoxia (HCC) J96.11    Disease is progressive 25% decline sept 2017 -> nov 2018; this I agree is a very poor sign for future outlool  Plan - Respect decision for supportive care without antifibrotics - respect desire not to participate in research trials - list ofev as allergy - gi side effects  - continue o2 2L at rest, 4L exertion - refer [facility] for home hospice support and goals of care for remainder of natural life  Followup  - 3 months or sooner if needed

## 2017-09-11 NOTE — Telephone Encounter (Signed)
Yes he has advanced IPF and if dsease runs its natural course I will not be surprised if he is deceased in 42 months   Dr. Brand Males, M.D., [facility].C.P Pulmonary and Critical Care Medicine Staff Physician, [place] Director - Interstitial Lung Disease  Program  Pulmonary [place] at [place], [place], [address]  Pager: [phone], If no answer or between  15:00h - 7:00h: call [phone] Telephone: [phone]

## 2017-09-11 NOTE — Telephone Encounter (Signed)
Called and spoke with [facility]. She is aware that we are giving verbals. Nothing further needed.

## 2017-09-25 ENCOUNTER — Telehealth: Payer: Self-pay | Admitting: Cardiology

## 2017-09-25 MED ORDER — LOSARTAN POTASSIUM 25 MG PO TABS: 12.5000 mg | ORAL_TABLET | Freq: Every day | ORAL | 3 refills | Status: AC

## 2017-09-25 NOTE — Telephone Encounter (Signed)
OK to reduce Losartan to 12.5 mg po daily.

## 2017-09-25 NOTE — Telephone Encounter (Signed)
Left message call back

## 2017-09-25 NOTE — Telephone Encounter (Signed)
New Message    Maura hospice nurse with patient is calling about patients BP. She says that she has been following patient and his bp was running low 95/61 106/56. Dr. Percival Spanish had him on  losartan 25mg  but since the BP was so low they tried him on 1/2 tablet (12.5mg ). Patients BP today was 124/77. She thinks this dose is the one that works best and would like to know can Dr. Percival Spanish send order for this. Patient is currently home bound. Please call.

## 2017-09-25 NOTE — Telephone Encounter (Signed)
Spoke with Maura from hospice who states Pt BP has been running low, 95/61 106/56. He is prescribed losartan 25 mg daily. [name] states they cut it in half for one week at 12.5 mg and pt's BP improved. Today's BP 124/77. They are calling for advice and requesting to have an order to have Losartan decreased.      Routing to Dr. Percival Spanish

## 2017-09-25 NOTE — Telephone Encounter (Signed)
Updated Maura that per Dr. Percival Spanish it was ok to decrease Losartan to 12.5 mg daily. Verbalized understanding. Orders reflected in chart.

## 2017-10-02 ENCOUNTER — Telehealth: Payer: Self-pay | Admitting: Internal Medicine

## 2017-10-02 MED ORDER — MORPHINE SULFATE 20 MG/5ML PO SOLN: ORAL | 0 refills | Status: AC

## 2017-10-02 NOTE — Telephone Encounter (Signed)
Called and spoke with [facility] nurse, she states that the patient still has two days worth of the Morphine but he is wanting to switch to [place] so he wants to go ahead and have it sent in.   MR please advise, thank you.

## 2017-10-02 NOTE — Telephone Encounter (Signed)
Ok thanks. Hospice will have to remind [place] but will try to remember  Dr. Brand Males, M.D., [facility].C.P Pulmonary and Critical Care Medicine Staff Physician, [place] Director - Interstitial Lung Disease  Program  Pulmonary [place] at [place], [place], [address]  Pager: [phone], If no answer or between  15:00h - 7:00h: call [phone] Telephone: [phone]

## 2017-10-02 NOTE — Telephone Encounter (Signed)
This is fine and I Can sign in the afternoon

## 2017-10-02 NOTE — Telephone Encounter (Signed)
Rx has been printed, signed and faxed to [facility]. Seth Bake with Hospice has been made aware. Nothing further was needed.

## 2017-10-02 NOTE — Telephone Encounter (Signed)
Will send to MR as an FYI. Maudie Mercury said she will fix it for this prescription.

## 2017-10-12 ENCOUNTER — Telehealth: Payer: Self-pay | Admitting: Internal Medicine

## 2017-10-12 NOTE — Telephone Encounter (Signed)
Called and spoke to [place] with hospice. She wanted to give an update on patient's status and medications. She stated that the patient continues to progress/decline and the Roxanol Rx was changed by the hospice MD to 10 mg every 4 hours as needed. Patient continues to take medications but is only able to walk a very short distance in the home. She wanted this information to be passed along to MR. Nothing further needed.  Routing to MR as FYI.

## 2017-10-12 NOTE — Telephone Encounter (Signed)
Thanks. Will close message °

## 2017-12-04 ENCOUNTER — Telehealth: Payer: Self-pay | Admitting: Internal Medicine

## 2017-12-04 NOTE — Telephone Encounter (Signed)
Yes, for Juan Mcdaniel 09-16-1935 to continue hospice care. This is becaues his IPF is severe and natural course of illness I expect him not to be alive in 6 months from 12/04/2017  Dr. Brand Males, M.D., [facility].C.P Pulmonary and Critical Care Medicine Staff Physician, [place] Director - Interstitial Lung Disease  Program  Pulmonary [place] at [place], [place], [address]  Pager: [phone], If no answer or between  15:00h - 7:00h: call [phone] Telephone: [phone]

## 2017-12-04 NOTE — Telephone Encounter (Signed)
Spoke with [name] at [facility], aware of MR's recs.  Nothing further needed.

## 2017-12-04 NOTE — Telephone Encounter (Signed)
Called and spoke to Juan Mcdaniel with Hospice.  [name] stated pt is due to be recertified for hospice care.  [name] is requesting verbal from MR to continue hospice care.  MR please advise. Thanks

## 2017-12-18 ENCOUNTER — Telehealth: Payer: Self-pay | Admitting: Internal Medicine

## 2017-12-18 NOTE — Telephone Encounter (Signed)
[facility], unable to reach. Left message to give [place] a call back. Will route to MR as an FYI.

## 2017-12-18 NOTE — Telephone Encounter (Signed)
Ok wish him best. Please ensure hospice can and will follow patient there

## 2017-12-19 NOTE — Telephone Encounter (Signed)
[facility] x2 - line busy both times [facility]

## 2017-12-19 NOTE — Telephone Encounter (Signed)
[facility] H[facility] x2 - line busy both times [facility]

## 2017-12-20 NOTE — Telephone Encounter (Signed)
ATC Maura with [facility] at phone [phone] She is letting MR know that pt is moving in with nephew due to resp failure has worsen Nephew lives one hour away, MR is aware Per triage protocol, we have attempted to call back three times no answer, no VM and no response. Closing message at this time.

## 2017-12-25 ENCOUNTER — Telehealth: Payer: Self-pay | Admitting: Internal Medicine

## 2017-12-25 MED ORDER — ALBUTEROL SULFATE HFA 108 (90 BASE) MCG/ACT IN AERS: 2.0000 | INHALATION_SPRAY | Freq: Four times a day (QID) | RESPIRATORY_TRACT | 6 refills | Status: AC | PRN

## 2017-12-25 MED ORDER — ALBUTEROL SULFATE HFA 108 (90 BASE) MCG/ACT IN AERS: 2.0000 | INHALATION_SPRAY | Freq: Four times a day (QID) | RESPIRATORY_TRACT | 3 refills | Status: AC | PRN

## 2017-12-25 NOTE — Telephone Encounter (Signed)
Called and spoke with pt letting him know I was sending a refill of his ventolin inhaler to [facility] for him. Pt expressed understanding. Nothing further needed.

## 2017-12-25 NOTE — Telephone Encounter (Signed)
Rx Ventolin sent to pt's preferred pharmacy as well as [facility] per pt's request.  Called pt and left him a message that this was being done. Nothing further needed.

## 2018-01-13 DEATH — deceased

## 2018-06-07 IMAGING — CT CT CHEST HIGH RESOLUTION W/O CM
2 of 6 series · 15 of 36 positions shown, 18 images · non-contrast
Comparison: Chest CT 12/24/2015.

CLINICAL DATA: 80-year-old male with history of interstitial lung
disease. Chronic shortness of breath. Followup study.

EXAM:
CT CHEST WITHOUT CONTRAST
TECHNIQUE: Multidetector CT imaging of the chest was performed following the
standard protocol without intravenous contrast. High resolution
imaging of the lungs, as well as inspiratory and expiratory imaging,
was performed.

[Series 4: high resolution · axial · 0.71mm/px · z∈[-202,-12]mm · 12 of 107 slices shown, 15 images]
[im 6/107  mediastinal]
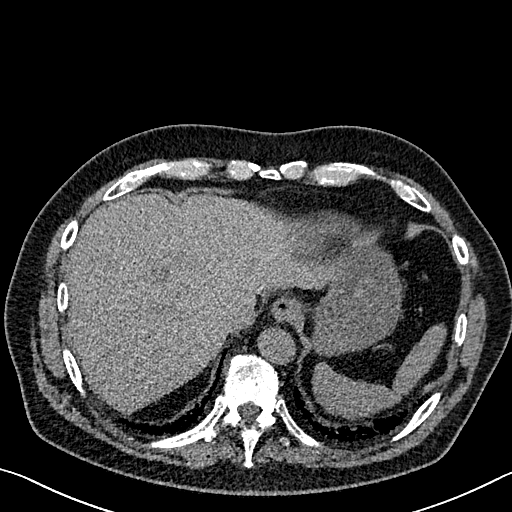
[im 6/107  lung]
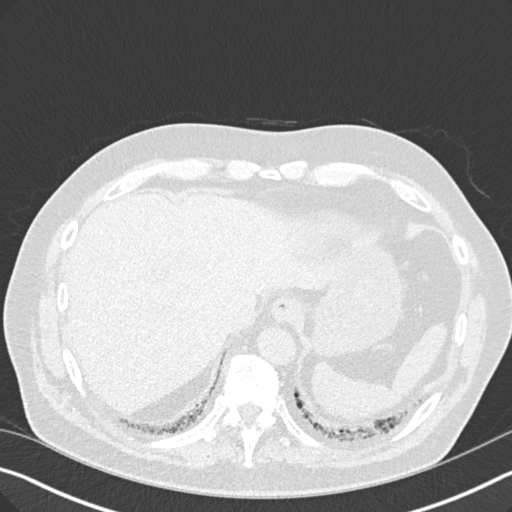
[im 17/107  lung]
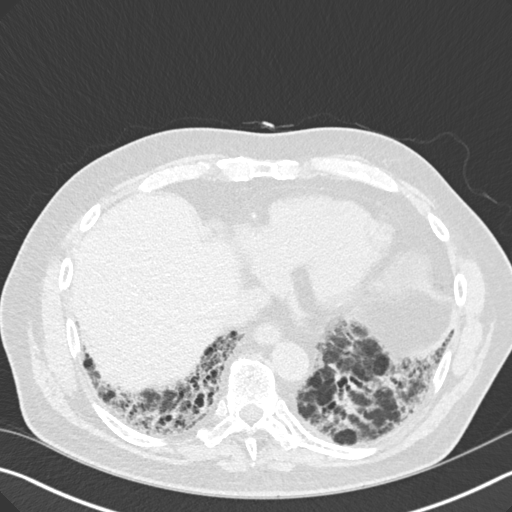
[im 23/107  lung]
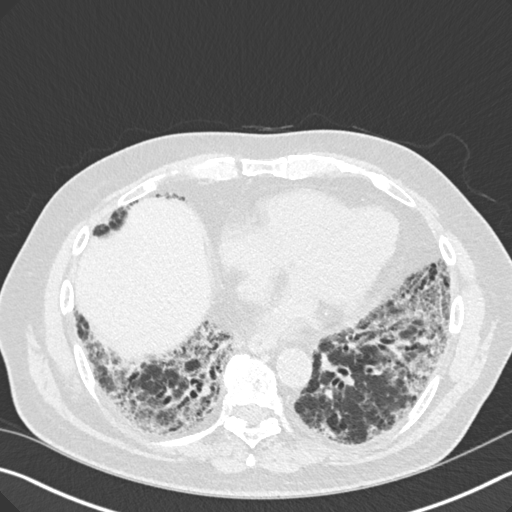
[im 34/107  lung]
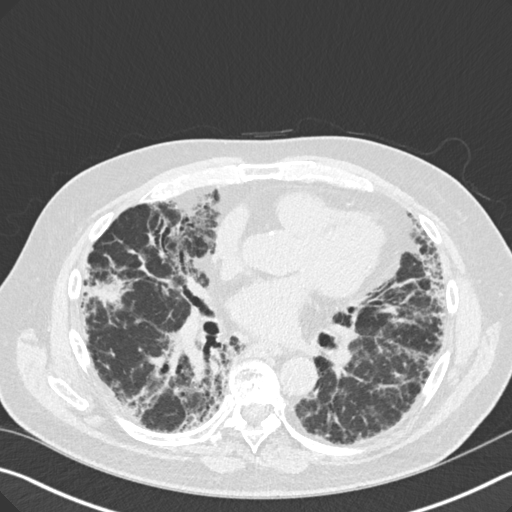
[im 40/107  mediastinal]
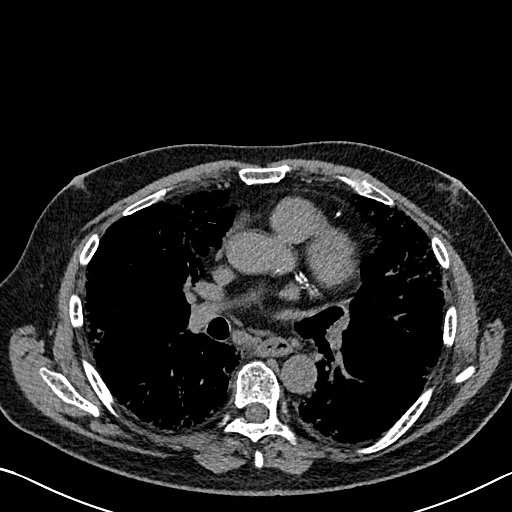
[im 40/107  lung]
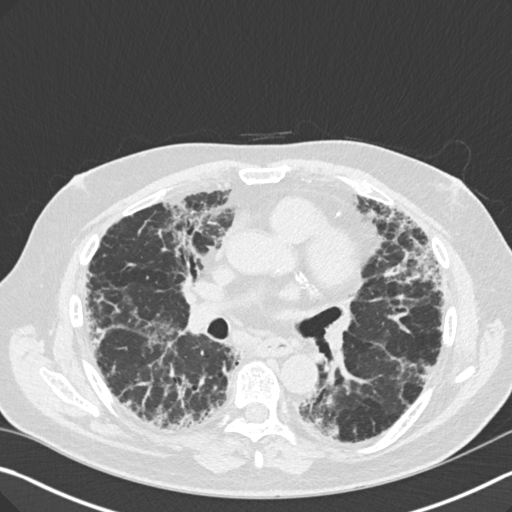
[im 51/107  lung]
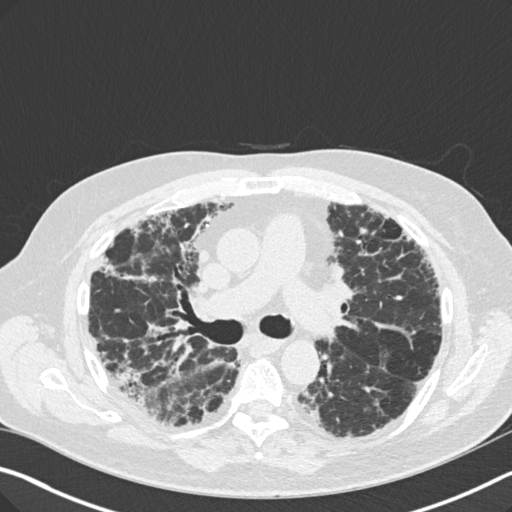
[im 56/107  lung]
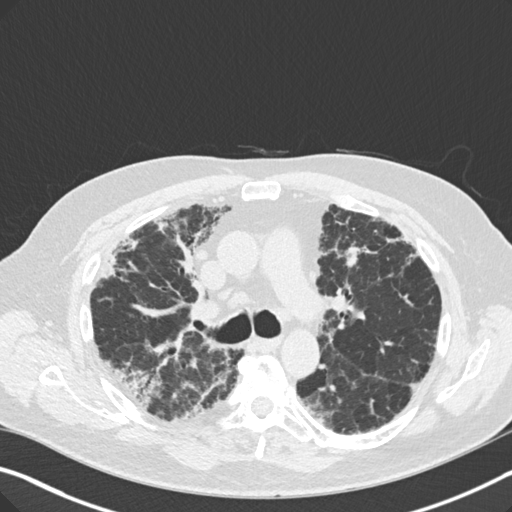
[im 67/107  lung]
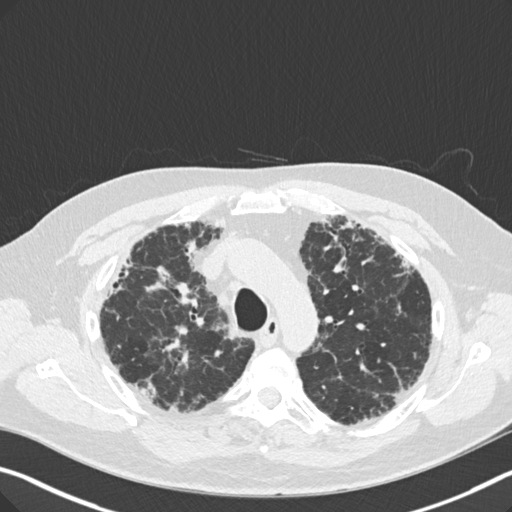
[im 73/107  mediastinal]
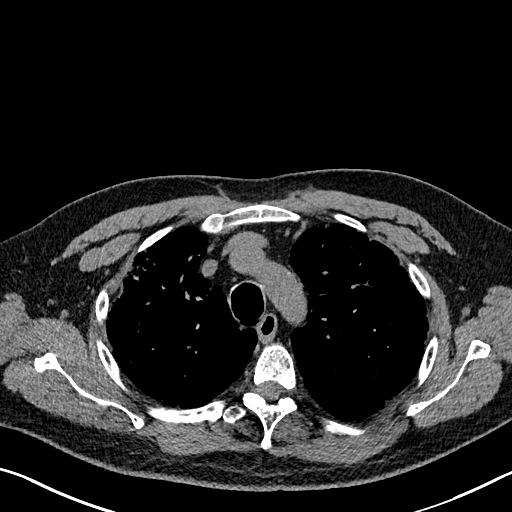
[im 73/107  lung]
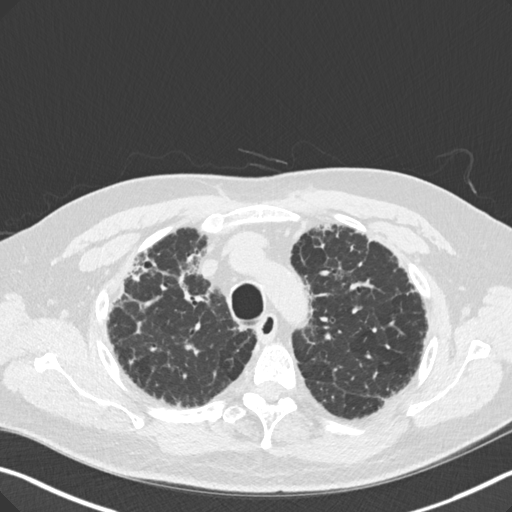
[im 84/107  lung]
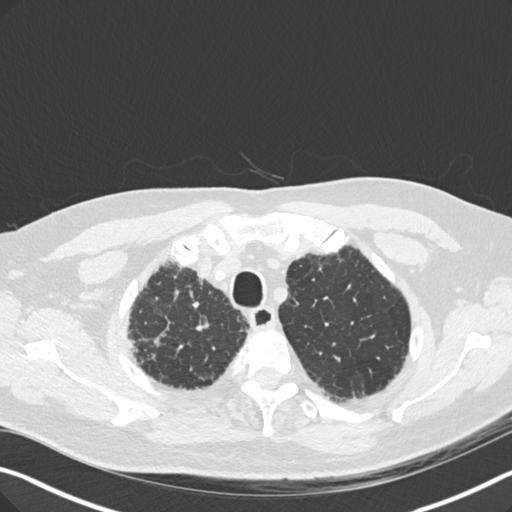
[im 90/107  lung]
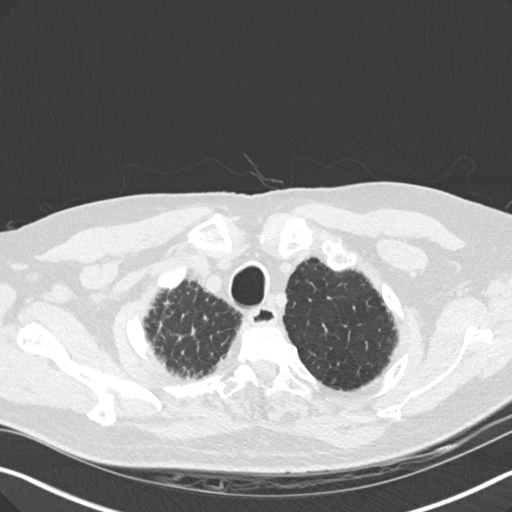
[im 101/107  lung]
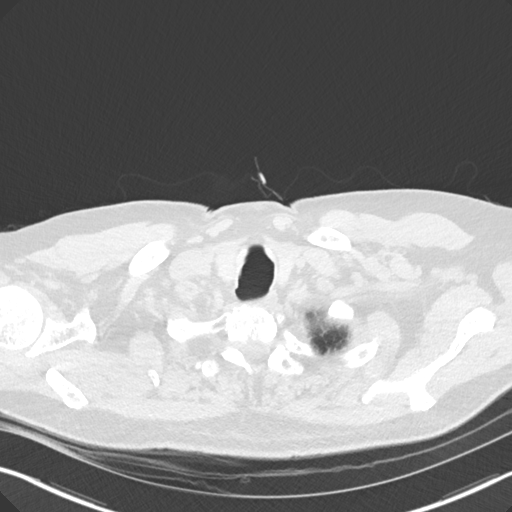

[Series 7: coronal · coronal · 0.59mm/px · 3 of 125 slices shown]
[im 25/125  lung]
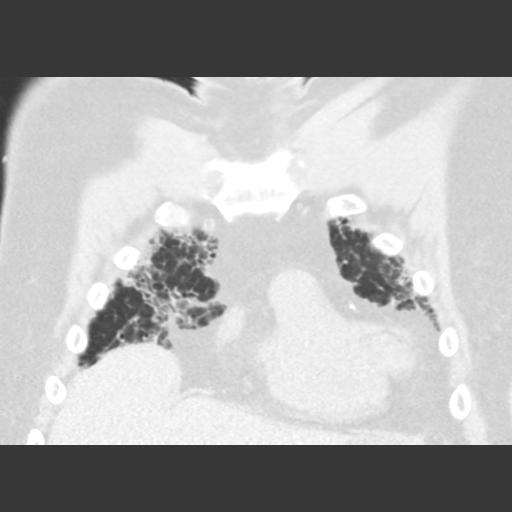
[im 50/125  lung]
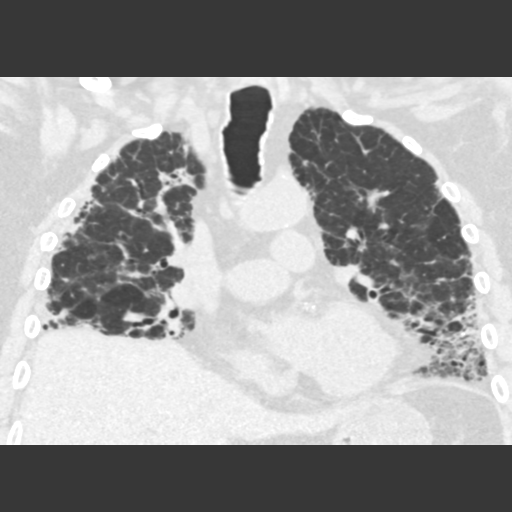
[im 75/125  lung]
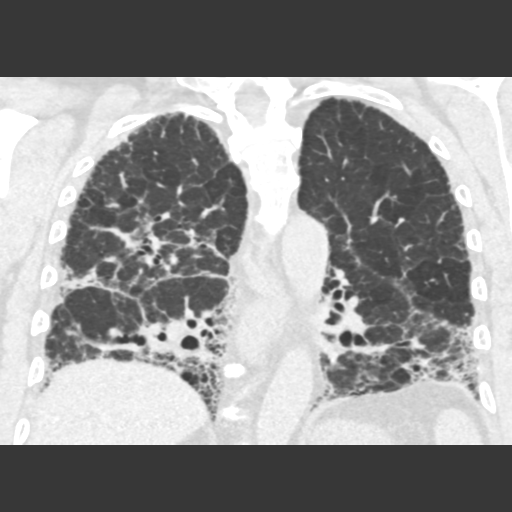

[15 of 36 positions shown; findings below may reference images not displayed]

FINDINGS: Cardiovascular: Heart size is normal. There is no significant
pericardial fluid, thickening or pericardial calcification. There is
aortic atherosclerosis, as well as atherosclerosis of the great
vessels of the mediastinum and the coronary arteries, including
calcified atherosclerotic plaque in the left main, left anterior
descending, left circumflex and right coronary arteries. There is
curvilinear hypoattenuation and rounding of the myocardium
throughout the left ventricular apex related to fibrofatty
metaplasia from prior distal LAD territory myocardial infarction(s).

Mediastinum/Nodes: Several scattered borderline enlarged and mildly
enlarged mediastinal lymph nodes measuring up tooth 16 mm in short
axis in the subcarinal nodal station. No definite hilar
lymphadenopathy. Esophagus is unremarkable in appearance. No
axillary lymphadenopathy.

Lungs/Pleura: High-resolution images demonstrate patchy areas of
septal thickening, extensive subpleural reticulation, parenchymal
banding, traction bronchiectasis and frank honeycombing. These
findings have a definitive craniocaudal gradient, and have increased
compared to the prior study from 12/24/2015, overall, considered
diagnostic of usual interstitial pneumonia. Inspiratory and
expiratory imaging demonstrates some moderate air trapping
indicative of small airways disease. No acute consolidative airspace
disease. No pleural effusions. No definite suspicious appearing
pulmonary nodules or masses are noted.

Upper Abdomen: Unremarkable.

Musculoskeletal: There are no aggressive appearing lytic or blastic
lesions noted in the visualized portions of the skeleton.
IMPRESSION: 1. The appearance of the lungs is compatible with interstitial lung
disease, and the pattern is considered diagnostic of usual
interstitial pneumonia (UIP), as discussed above.
2. Aortic atherosclerosis, in addition to left main and 3 vessel
coronary artery disease. In addition, there is evidence of prior LAD
territory myocardial infarction(s), as above. Assessment for
potential risk factor modification, dietary therapy or pharmacologic
therapy may be warranted, if clinically indicated.

Aortic Atherosclerosis ([id number]).

## 2018-09-13 ENCOUNTER — Telehealth: Payer: Self-pay | Admitting: Cardiology

## 2018-09-13 NOTE — Telephone Encounter (Signed)
[facility] to schedule virtual visit with Dr. Percival Spanish. Called pt from recall list.
